# Patient Record
Sex: Female | Born: 1970
Health system: Southern US, Community
[De-identification: ages and names within clinical notes are randomized; demographics above are authoritative.]

## PROBLEM LIST (undated history)

## (undated) DIAGNOSIS — M25474 Effusion, right foot: Secondary | ICD-10-CM

## (undated) DIAGNOSIS — M5430 Sciatica, unspecified side: Secondary | ICD-10-CM

## (undated) DIAGNOSIS — E559 Vitamin D deficiency, unspecified: Secondary | ICD-10-CM

## (undated) DIAGNOSIS — M25472 Effusion, left ankle: Secondary | ICD-10-CM

## (undated) DIAGNOSIS — D649 Anemia, unspecified: Secondary | ICD-10-CM

## (undated) DIAGNOSIS — M25569 Pain in unspecified knee: Secondary | ICD-10-CM

## (undated) DIAGNOSIS — M25471 Effusion, right ankle: Secondary | ICD-10-CM

## (undated) DIAGNOSIS — M549 Dorsalgia, unspecified: Secondary | ICD-10-CM

## (undated) DIAGNOSIS — M25475 Effusion, left foot: Secondary | ICD-10-CM

## (undated) DIAGNOSIS — T7840XA Allergy, unspecified, initial encounter: Secondary | ICD-10-CM

## (undated) HISTORY — DX: Allergy, unspecified, initial encounter: T78.40XA

## (undated) HISTORY — DX: Vitamin D deficiency, unspecified: E55.9

## (undated) HISTORY — DX: Effusion, right foot: M25.474

## (undated) HISTORY — DX: Effusion, left foot: M25.475

## (undated) HISTORY — DX: Pain in unspecified knee: M25.569

## (undated) HISTORY — DX: Anemia, unspecified: D64.9

## (undated) HISTORY — DX: Dorsalgia, unspecified: M54.9

## (undated) HISTORY — PX: NOSE SURGERY: SHX723

## (undated) HISTORY — DX: Effusion, right ankle: M25.471

## (undated) HISTORY — PX: ABLATION: SHX5711

## (undated) HISTORY — DX: Effusion, left ankle: M25.472

## (undated) HISTORY — DX: Sciatica, unspecified side: M54.30

---

## 2004-12-31 HISTORY — PX: TUBAL LIGATION: SHX77

## 2015-01-04 ENCOUNTER — Ambulatory Visit
Admission: RE | Admit: 2015-01-04 | Discharge: 2015-01-04 | Disposition: A | Discharge status: Home / Self Care | Payer: Medicaid Other | Source: Ambulatory Visit | Attending: Family Medicine | Admitting: Family Medicine

## 2015-01-04 ENCOUNTER — Other Ambulatory Visit: Payer: Self-pay | Admitting: Family Medicine

## 2015-01-04 DIAGNOSIS — M545 Low back pain, unspecified: Secondary | ICD-10-CM

## 2015-01-04 DIAGNOSIS — G8929 Other chronic pain: Secondary | ICD-10-CM

## 2015-08-13 ENCOUNTER — Other Ambulatory Visit: Payer: Self-pay

## 2015-08-13 DIAGNOSIS — Z1231 Encounter for screening mammogram for malignant neoplasm of breast: Secondary | ICD-10-CM

## 2015-08-22 ENCOUNTER — Ambulatory Visit
Admission: RE | Admit: 2015-08-22 | Discharge: 2015-08-22 | Disposition: A | Discharge status: Home / Self Care | Payer: Medicaid Other | Source: Ambulatory Visit

## 2015-08-22 DIAGNOSIS — Z1231 Encounter for screening mammogram for malignant neoplasm of breast: Secondary | ICD-10-CM

## 2016-09-10 DIAGNOSIS — Z23 Encounter for immunization: Secondary | ICD-10-CM | POA: Diagnosis not present

## 2016-10-09 ENCOUNTER — Other Ambulatory Visit: Payer: Self-pay | Admitting: Family Medicine

## 2016-10-09 DIAGNOSIS — Z1231 Encounter for screening mammogram for malignant neoplasm of breast: Secondary | ICD-10-CM

## 2016-10-29 ENCOUNTER — Ambulatory Visit: Payer: Medicaid Other

## 2016-11-19 ENCOUNTER — Ambulatory Visit
Admission: RE | Admit: 2016-11-19 | Discharge: 2016-11-19 | Disposition: A | Discharge status: Home / Self Care | Payer: Medicaid Other | Source: Ambulatory Visit | Attending: Family Medicine | Admitting: Family Medicine

## 2016-11-19 DIAGNOSIS — Z1231 Encounter for screening mammogram for malignant neoplasm of breast: Secondary | ICD-10-CM

## 2017-01-17 ENCOUNTER — Emergency Department (HOSPITAL_COMMUNITY)
Admission: EM | Admit: 2017-01-17 | Discharge: 2017-01-17 | Disposition: A | Discharge status: Home / Self Care | Payer: Medicaid Other | Attending: Emergency Medicine | Admitting: Emergency Medicine

## 2017-01-17 ENCOUNTER — Emergency Department (HOSPITAL_COMMUNITY): Payer: Medicaid Other

## 2017-01-17 ENCOUNTER — Encounter (HOSPITAL_COMMUNITY): Payer: Self-pay

## 2017-01-17 DIAGNOSIS — D649 Anemia, unspecified: Secondary | ICD-10-CM | POA: Insufficient documentation

## 2017-01-17 DIAGNOSIS — R0789 Other chest pain: Secondary | ICD-10-CM | POA: Insufficient documentation

## 2017-01-17 LAB — CBC
HCT: 34.4 % — ABNORMAL LOW (ref 36.0–46.0)
Hemoglobin: 11 g/dL — ABNORMAL LOW (ref 12.0–15.0)
MCH: 28.9 pg (ref 26.0–34.0)
MCHC: 32 g/dL (ref 30.0–36.0)
MCV: 90.3 fL (ref 78.0–100.0)
Platelets: 325 10*3/uL (ref 150–400)
RBC: 3.81 MIL/uL — ABNORMAL LOW (ref 3.87–5.11)
RDW: 14.1 % (ref 11.5–15.5)
WBC: 8.3 10*3/uL (ref 4.0–10.5)

## 2017-01-17 LAB — URINALYSIS, ROUTINE W REFLEX MICROSCOPIC
Bacteria, UA: NONE SEEN
Bilirubin Urine: NEGATIVE
Glucose, UA: NEGATIVE mg/dL
Hgb urine dipstick: NEGATIVE
Ketones, ur: NEGATIVE mg/dL
Nitrite: NEGATIVE
Protein, ur: 30 mg/dL — AB
Specific Gravity, Urine: 1.027 (ref 1.005–1.030)
pH: 5 (ref 5.0–8.0)

## 2017-01-17 LAB — BASIC METABOLIC PANEL
Anion gap: 8 (ref 5–15)
BUN: 7 mg/dL (ref 6–20)
CO2: 24 mmol/L (ref 22–32)
Calcium: 9 mg/dL (ref 8.9–10.3)
Chloride: 107 mmol/L (ref 101–111)
Creatinine, Ser: 0.81 mg/dL (ref 0.44–1.00)
GFR calc Af Amer: >60 mL/min (ref >60)
GFR calc non Af Amer: >60 mL/min (ref >60)
Glucose, Bld: 138 mg/dL — ABNORMAL HIGH (ref 65–99)
Potassium: 4 mmol/L (ref 3.5–5.1)
Sodium: 139 mmol/L (ref 135–145)

## 2017-01-17 MED ORDER — HYDROMORPHONE HCL 2 MG/ML IJ SOLN
1.0000 mg | Freq: Once | INTRAMUSCULAR | Status: AC
  Administered 2017-01-17: 1 mg via INTRAVENOUS
  Filled 2017-01-17: qty 1

## 2017-01-17 MED ORDER — MORPHINE SULFATE (PF) 4 MG/ML IV SOLN
4.0000 mg | Freq: Once | INTRAVENOUS | Status: AC
  Administered 2017-01-17: 4 mg via INTRAVENOUS
  Filled 2017-01-17: qty 1

## 2017-01-17 MED ORDER — ONDANSETRON HCL 4 MG/2ML IJ SOLN
4.0000 mg | Freq: Once | INTRAMUSCULAR | Status: AC
  Administered 2017-01-17: 4 mg via INTRAVENOUS
  Filled 2017-01-17: qty 2

## 2017-01-17 MED ORDER — OXYCODONE-ACETAMINOPHEN 5-325 MG PO TABS: 1.0000 | ORAL_TABLET | ORAL | 0 refills | Status: DC | PRN

## 2017-01-17 MED ORDER — KETOROLAC TROMETHAMINE 60 MG/2ML IM SOLN
60.0000 mg | Freq: Once | INTRAMUSCULAR | Status: AC
  Administered 2017-01-17: 60 mg via INTRAMUSCULAR
  Filled 2017-01-17: qty 2

## 2017-01-17 NOTE — Discharge Instructions (Signed)
Return if symptoms are getting worse. °

## 2017-01-17 NOTE — ED Notes (Signed)
Pt made aware that we need a urine sample.

## 2017-01-17 NOTE — ED Provider Notes (Signed)
[facility]-EMERGENCY DEPT Provider Note   CSN: [id number] Arrival date & time: 01/17/17  0226    By signing my name below, I, Valentino Saxon, attest that this documentation has been prepared under the direction and in the presence of Dione Booze, MD. Electronically Signed: Valentino Saxon, ED Scribe. 01/17/17. 2:54 AM.  History   Chief Complaint Chief Complaint  Patient presents with  . Flank Pain   The history is provided by the patient. No language interpreter was used.   HPI Comments: Melissa Rasmussen is a 46 y.o. female brought in by ambulance who presents to the Emergency Department complaining of 10/10, constant, left-sided flank pain that occurred yesterday morning. Pt denies recent trauma or injury to the affected area. Pt states she suddenly woke up with her pain and notes it has not subsided. She states her pain has gradually worsened with breathing, movement and direct pressure to area. No alleviating factors noted. She denies use of OTC medications and/or heat and ice therapy. Pt denies cough, fever, chills, nausea, vomiting, trouble urinating. Pt notes hx of tubal ligation.   History reviewed. No pertinent past medical history.  There are no active problems to display for this patient.   History reviewed. No pertinent surgical history.  OB History    No data available       Home Medications    Prior to Admission medications   Not on File    Family History History reviewed. No pertinent family history.  Social History Social History  Substance Use Topics  . Smoking status: Never Smoker  . Smokeless tobacco: Not on file  . Alcohol use Yes     Comment: occ     Allergies   Patient has no known allergies.   Review of Systems Review of Systems  Constitutional: Negative for chills and fever.  Respiratory: Negative for cough.   Gastrointestinal: Negative for nausea and vomiting.  Genitourinary: Positive for flank pain. Negative for difficulty urinating.    All other systems reviewed and are negative.    Physical Exam Updated Vital Signs BP 107/71   Pulse 95   Temp 98.3 F (36.8 C) (Oral)   Resp 16   Ht 5\' 2"  (1.575 m)   Wt 240 lb (108.9 kg)   LMP 01/11/2017 (Exact Date)   SpO2 100%   BMI 43.90 kg/m   Physical Exam  Constitutional: She is oriented to person, place, and time. She appears well-developed and well-nourished.  Appears uncomfroatble.   HENT:  Head: Normocephalic and atraumatic.  Eyes: EOM are normal. Pupils are equal, round, and reactive to light.  Neck: Normal range of motion. Neck supple. No JVD present.  Cardiovascular: Normal rate, regular rhythm and normal heart sounds.   No murmur heard. Pulmonary/Chest: Effort normal and breath sounds normal. She has no wheezes. She has no rales. She exhibits tenderness.  Marked tenderness over left lateral ribcage.   Abdominal: Soft. Bowel sounds are normal. She exhibits no distension and no mass. There is no tenderness.  Musculoskeletal: Normal range of motion. She exhibits no edema.  Lymphadenopathy:    She has no cervical adenopathy.  Neurological: She is alert and oriented to person, place, and time. No cranial nerve deficit. She exhibits normal muscle tone. Coordination normal.  Skin: Skin is warm and dry. No rash noted.  Psychiatric: She has a normal mood and affect. Her behavior is normal. Judgment and thought content normal.  Nursing note and vitals reviewed.    ED Treatments / Results  DIAGNOSTIC STUDIES: Oxygen Saturation is 100% on RA, normal by my interpretation.    COORDINATION OF CARE: 2:50 AM Discussed treatment plan with pt at bedside which includes labs, unilateral rib with chest imaging and pain medication and pt agreed to plan.   Labs (all labs ordered are listed, but only abnormal results are displayed) Labs Reviewed  URINALYSIS, ROUTINE W REFLEX MICROSCOPIC - Abnormal; Notable for the following:       Result Value   APPearance HAZY (*)     Protein, ur 30 (*)    Leukocytes, UA TRACE (*)    Squamous Epithelial / LPF 6-30 (*)    All other components within normal limits  BASIC METABOLIC PANEL - Abnormal; Notable for the following:    Glucose, Bld 138 (*)    All other components within normal limits  CBC - Abnormal; Notable for the following:    RBC 3.81 (*)    Hemoglobin 11.0 (*)    HCT 34.4 (*)    All other components within normal limits    Radiology Dg Ribs Unilateral W/chest Left  Result Date: 01/17/2017 CLINICAL DATA:  Left-sided anterior rib pain EXAM: LEFT RIBS AND CHEST - 3+ VIEW COMPARISON:  None. FINDINGS: No fracture or other bone lesions are seen involving the ribs. There is no evidence of pneumothorax or pleural effusion. Both lungs are clear. Heart size and mediastinal contours are within normal limits. IMPRESSION: Negative. Electronically Signed   By: Tollie Ethavid  Kwon M.D.   On: 01/17/2017 03:29    Procedures Procedures (including critical care time)  Medications Ordered in ED Medications  ketorolac (TORADOL) injection 60 mg (60 mg Intramuscular Given 01/17/17 0303)  morphine 4 MG/ML injection 4 mg (4 mg Intravenous Given 01/17/17 0455)  HYDROmorphone (DILAUDID) injection 1 mg (1 mg Intravenous Given 01/17/17 0744)  ondansetron (ZOFRAN) injection 4 mg (4 mg Intravenous Given 01/17/17 0743)     Initial Impression / Assessment and Plan / ED Course  I have reviewed the triage vital signs and the nursing notes.  Pertinent labs & imaging results that were available during my care of the patient were reviewed by me and considered in my medical decision making (see chart for details).  Left chest pain which appears to be chest wall pain. No rash visible to suggest herpes zoster. ED workup is unremarkable except for mild anemia. She was given ketorolac which has not given her any relief whatsoever. She is given 2 doses of morphine which gave minimal relief. She's given a dose of hydromorphone with good relief of pain.  She is discharged with prescription for oxycodone have acetaminophen and is referred back to PCP for follow-up. Return precautions discussed.  Final Clinical Impressions(s) / ED Diagnoses   Final diagnoses:  Left-sided chest wall pain  Normochromic normocytic anemia    New Prescriptions New Prescriptions   OXYCODONE-ACETAMINOPHEN (PERCOCET) 5-325 MG TABLET    Take 1-2 tablets by mouth every 4 (four) hours as needed for moderate pain or severe pain.    I personally performed the services described in this documentation, which was scribed in my presence. The recorded information has been reviewed and is accurate.       Dione Boozeavid Gennesis Hogland, MD 01/17/17 [phone]

## 2017-01-17 NOTE — ED Triage Notes (Signed)
Per EMS, pt from home, pt woke up to left flank pain, worse with movement and breathing, denies injury or urinary sx. Pt states it's been going on all day. Denies nausea. Pt ambulatory with EMS to stretcher. VS 120/88, Pulse 110, RR 16, spo2 100%.

## 2017-02-12 DIAGNOSIS — M545 Low back pain: Secondary | ICD-10-CM | POA: Diagnosis not present

## 2017-02-12 DIAGNOSIS — M25511 Pain in right shoulder: Secondary | ICD-10-CM | POA: Diagnosis not present

## 2017-02-12 DIAGNOSIS — J309 Allergic rhinitis, unspecified: Secondary | ICD-10-CM | POA: Diagnosis not present

## 2017-02-19 ENCOUNTER — Other Ambulatory Visit (HOSPITAL_BASED_OUTPATIENT_CLINIC_OR_DEPARTMENT_OTHER): Payer: Self-pay

## 2017-02-19 DIAGNOSIS — G4700 Insomnia, unspecified: Secondary | ICD-10-CM

## 2017-02-25 DIAGNOSIS — R32 Unspecified urinary incontinence: Secondary | ICD-10-CM | POA: Diagnosis not present

## 2017-02-25 DIAGNOSIS — N760 Acute vaginitis: Secondary | ICD-10-CM | POA: Diagnosis not present

## 2017-02-25 DIAGNOSIS — D573 Sickle-cell trait: Secondary | ICD-10-CM | POA: Insufficient documentation

## 2017-02-25 DIAGNOSIS — Z6841 Body Mass Index (BMI) 40.0 and over, adult: Secondary | ICD-10-CM | POA: Diagnosis not present

## 2017-02-25 DIAGNOSIS — G43909 Migraine, unspecified, not intractable, without status migrainosus: Secondary | ICD-10-CM | POA: Insufficient documentation

## 2017-02-25 DIAGNOSIS — Z01419 Encounter for gynecological examination (general) (routine) without abnormal findings: Secondary | ICD-10-CM | POA: Diagnosis not present

## 2017-02-25 DIAGNOSIS — Z139 Encounter for screening, unspecified: Secondary | ICD-10-CM | POA: Diagnosis not present

## 2017-02-25 DIAGNOSIS — N915 Oligomenorrhea, unspecified: Secondary | ICD-10-CM | POA: Diagnosis not present

## 2017-02-25 DIAGNOSIS — Z124 Encounter for screening for malignant neoplasm of cervix: Secondary | ICD-10-CM | POA: Diagnosis not present

## 2017-03-01 ENCOUNTER — Other Ambulatory Visit: Payer: Self-pay | Admitting: Family Medicine

## 2017-03-01 ENCOUNTER — Ambulatory Visit
Admission: RE | Admit: 2017-03-01 | Discharge: 2017-03-01 | Disposition: A | Discharge status: Home / Self Care | Payer: BLUE CROSS/BLUE SHIELD | Source: Ambulatory Visit | Attending: Family Medicine | Admitting: Family Medicine

## 2017-03-01 DIAGNOSIS — M25511 Pain in right shoulder: Secondary | ICD-10-CM

## 2017-03-11 DIAGNOSIS — D259 Leiomyoma of uterus, unspecified: Secondary | ICD-10-CM | POA: Diagnosis not present

## 2017-03-11 DIAGNOSIS — N898 Other specified noninflammatory disorders of vagina: Secondary | ICD-10-CM | POA: Diagnosis not present

## 2017-03-15 DIAGNOSIS — L02611 Cutaneous abscess of right foot: Secondary | ICD-10-CM | POA: Diagnosis not present

## 2017-03-15 DIAGNOSIS — M79674 Pain in right toe(s): Secondary | ICD-10-CM | POA: Diagnosis not present

## 2017-03-15 DIAGNOSIS — L03031 Cellulitis of right toe: Secondary | ICD-10-CM | POA: Diagnosis not present

## 2017-03-16 ENCOUNTER — Ambulatory Visit (HOSPITAL_BASED_OUTPATIENT_CLINIC_OR_DEPARTMENT_OTHER): Payer: BLUE CROSS/BLUE SHIELD | Attending: Family Medicine | Admitting: Internal Medicine

## 2017-03-16 DIAGNOSIS — G4700 Insomnia, unspecified: Secondary | ICD-10-CM | POA: Insufficient documentation

## 2017-03-16 DIAGNOSIS — G4733 Obstructive sleep apnea (adult) (pediatric): Secondary | ICD-10-CM | POA: Insufficient documentation

## 2017-03-20 DIAGNOSIS — G4700 Insomnia, unspecified: Secondary | ICD-10-CM | POA: Diagnosis not present

## 2017-03-20 NOTE — Procedures (Signed)
   Patient Name: Melissa Rasmussen, Melissa Rasmussen Date: 03/16/2017 Gender: Female D.O.B: Jan 21, 1971 Age (years): 45 Referring Provider: Renaye Rakers Height (inches): 63 Interpreting Physician: Jetty Duhamel MD, ABSM Weight (lbs): 250 RPSGT: [place] Sink BMI: 44 MRN: [id number] Neck Size: 15.00 CLINICAL INFORMATION Sleep Study Type: HST  Indication for sleep study: Insomnia  Epworth Sleepiness Score: 8  SLEEP STUDY TECHNIQUE A multi-channel overnight portable sleep study was performed. The channels recorded were: nasal airflow, thoracic respiratory movement, and oxygen saturation with a pulse oximetry. Snoring was also monitored.  MEDICATIONS Patient self administered medications include: none reported.  SLEEP ARCHITECTURE Patient was studied for 385.0 minutes. The sleep efficiency was 97.5 % and the patient was supine for 98.8%. The arousal index was 0.0 per hour.  RESPIRATORY PARAMETERS The overall AHI was 12.2 per hour, with a central apnea index of 0.2 per hour.  The oxygen nadir was 79% during sleep.  CARDIAC DATA Mean heart rate during sleep was 87.5 bpm.  IMPRESSIONS - Mild obstructive sleep apnea occurred during this study (AHI = 12.2/h). - No significant central sleep apnea occurred during this study (CAI = 0.2/h). - Severe oxygen desaturation was noted during this study (Min O2 = 79%, Mean 96%). - Patient snored.  DIAGNOSIS - Obstructive Sleep Apnea (327.23 [G47.33 ICD-10])  RECOMMENDATIONS - Therapeutic CPAP titration to determine optimal pressure required to alleviate sleep disordered breathing. - Oral appliance may be considered. - Avoid alcohol, sedatives and other CNS depressants that may worsen sleep apnea and disrupt normal sleep architecture. - Sleep hygiene should be reviewed to assess factors that may improve sleep quality. - Weight management and regular exercise should be initiated or continued.  [Electronically signed] 03/20/2017 01:47  PM  Jetty Duhamel MD, ABSM Diplomate, American Board of Sleep Medicine   NPI: [id number]  Waymon Budge Diplomate, American Board of Sleep Medicine  ELECTRONICALLY SIGNED ON:  03/20/2017, 1:44 PM [facility] PH: [phone]   FX: [phone] ACCREDITED BY THE AMERICAN ACADEMY OF SLEEP MEDICINE

## 2017-03-25 DIAGNOSIS — M79674 Pain in right toe(s): Secondary | ICD-10-CM | POA: Diagnosis not present

## 2017-03-25 DIAGNOSIS — L03031 Cellulitis of right toe: Secondary | ICD-10-CM | POA: Diagnosis not present

## 2017-03-30 ENCOUNTER — Ambulatory Visit
Admission: RE | Admit: 2017-03-30 | Discharge: 2017-03-30 | Disposition: A | Discharge status: Home / Self Care | Payer: BLUE CROSS/BLUE SHIELD | Source: Ambulatory Visit | Attending: Family Medicine | Admitting: Family Medicine

## 2017-03-30 ENCOUNTER — Other Ambulatory Visit: Payer: Self-pay | Admitting: Family Medicine

## 2017-03-30 DIAGNOSIS — M545 Low back pain: Secondary | ICD-10-CM | POA: Diagnosis not present

## 2017-04-02 DIAGNOSIS — D259 Leiomyoma of uterus, unspecified: Secondary | ICD-10-CM | POA: Diagnosis not present

## 2017-06-17 DIAGNOSIS — E7800 Pure hypercholesterolemia, unspecified: Secondary | ICD-10-CM | POA: Diagnosis not present

## 2017-07-02 DIAGNOSIS — G43911 Migraine, unspecified, intractable, with status migrainosus: Secondary | ICD-10-CM | POA: Diagnosis not present

## 2017-07-02 DIAGNOSIS — F5001 Anorexia nervosa, restricting type: Secondary | ICD-10-CM | POA: Diagnosis not present

## 2017-07-02 DIAGNOSIS — F5101 Primary insomnia: Secondary | ICD-10-CM | POA: Diagnosis not present

## 2017-07-02 DIAGNOSIS — E6609 Other obesity due to excess calories: Secondary | ICD-10-CM | POA: Diagnosis not present

## 2017-07-02 DIAGNOSIS — I10 Essential (primary) hypertension: Secondary | ICD-10-CM | POA: Diagnosis not present

## 2017-07-02 DIAGNOSIS — R635 Abnormal weight gain: Secondary | ICD-10-CM | POA: Diagnosis not present

## 2017-07-12 DIAGNOSIS — E6609 Other obesity due to excess calories: Secondary | ICD-10-CM | POA: Diagnosis not present

## 2017-07-12 DIAGNOSIS — M545 Low back pain: Secondary | ICD-10-CM | POA: Diagnosis not present

## 2017-08-05 DIAGNOSIS — E6609 Other obesity due to excess calories: Secondary | ICD-10-CM | POA: Diagnosis not present

## 2017-09-02 DIAGNOSIS — M7672 Peroneal tendinitis, left leg: Secondary | ICD-10-CM | POA: Diagnosis not present

## 2017-09-16 DIAGNOSIS — Z6841 Body Mass Index (BMI) 40.0 and over, adult: Secondary | ICD-10-CM | POA: Diagnosis not present

## 2017-09-16 DIAGNOSIS — R110 Nausea: Secondary | ICD-10-CM | POA: Diagnosis not present

## 2017-10-13 DIAGNOSIS — M7672 Peroneal tendinitis, left leg: Secondary | ICD-10-CM | POA: Diagnosis not present

## 2017-11-04 DIAGNOSIS — M7672 Peroneal tendinitis, left leg: Secondary | ICD-10-CM | POA: Diagnosis not present

## 2018-03-08 DIAGNOSIS — Z01419 Encounter for gynecological examination (general) (routine) without abnormal findings: Secondary | ICD-10-CM | POA: Diagnosis not present

## 2018-03-08 DIAGNOSIS — Z113 Encounter for screening for infections with a predominantly sexual mode of transmission: Secondary | ICD-10-CM | POA: Diagnosis not present

## 2018-03-08 DIAGNOSIS — Z6841 Body Mass Index (BMI) 40.0 and over, adult: Secondary | ICD-10-CM | POA: Diagnosis not present

## 2018-03-08 DIAGNOSIS — Z139 Encounter for screening, unspecified: Secondary | ICD-10-CM | POA: Diagnosis not present

## 2018-03-08 DIAGNOSIS — Z124 Encounter for screening for malignant neoplasm of cervix: Secondary | ICD-10-CM | POA: Diagnosis not present

## 2018-07-14 ENCOUNTER — Other Ambulatory Visit: Payer: Self-pay | Admitting: Family Medicine

## 2018-07-14 DIAGNOSIS — Z1231 Encounter for screening mammogram for malignant neoplasm of breast: Secondary | ICD-10-CM

## 2018-08-15 ENCOUNTER — Ambulatory Visit
Admission: RE | Admit: 2018-08-15 | Discharge: 2018-08-15 | Disposition: A | Discharge status: Home / Self Care | Payer: BLUE CROSS/BLUE SHIELD | Source: Ambulatory Visit | Attending: Family Medicine | Admitting: Family Medicine

## 2018-08-15 DIAGNOSIS — Z1231 Encounter for screening mammogram for malignant neoplasm of breast: Secondary | ICD-10-CM

## 2018-09-29 IMAGING — CR DG SHOULDER 2+V*R*
4 series · 4 of 4 positions shown · non-contrast
Comparison: None.

CLINICAL DATA: Right shoulder pain, no known injury

EXAM:
RIGHT SHOULDER - 2+ VIEW

[w shoulder grashey right]
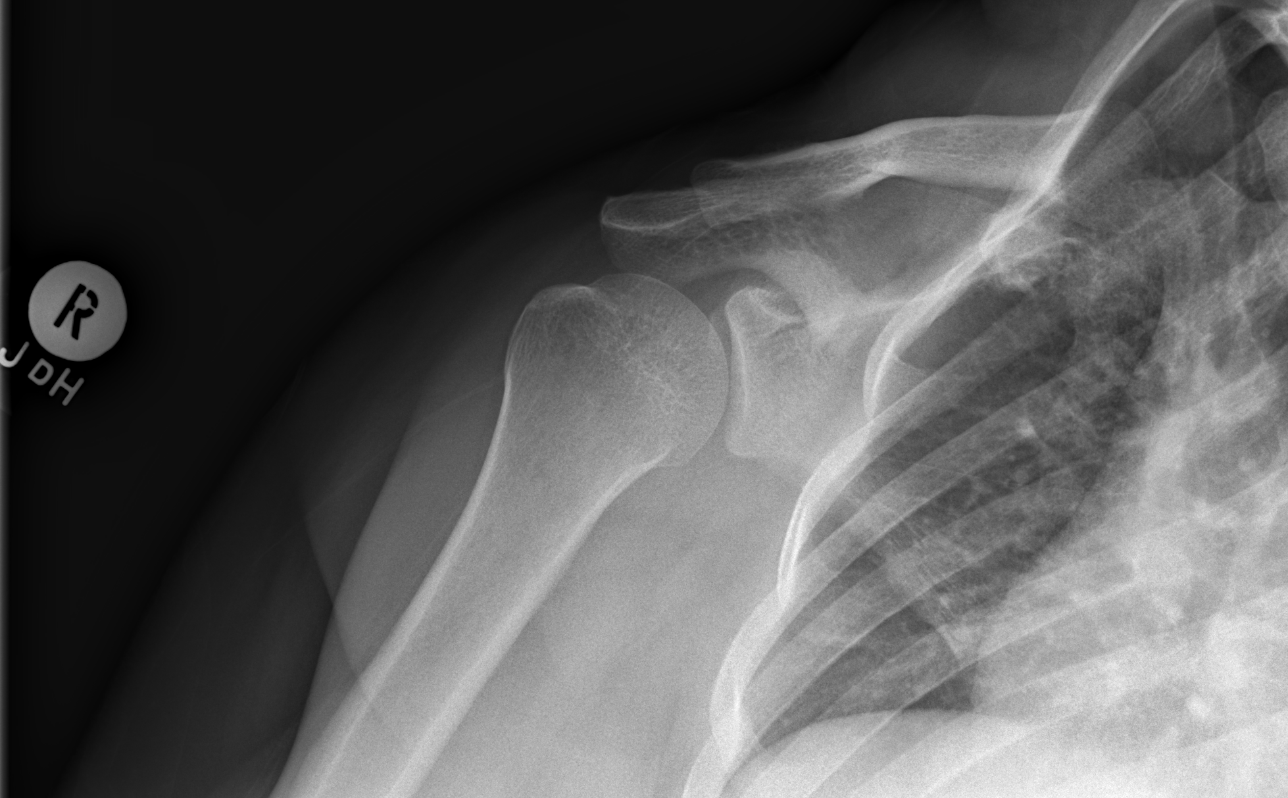

[w shoulder y-view right (1 of 2)]
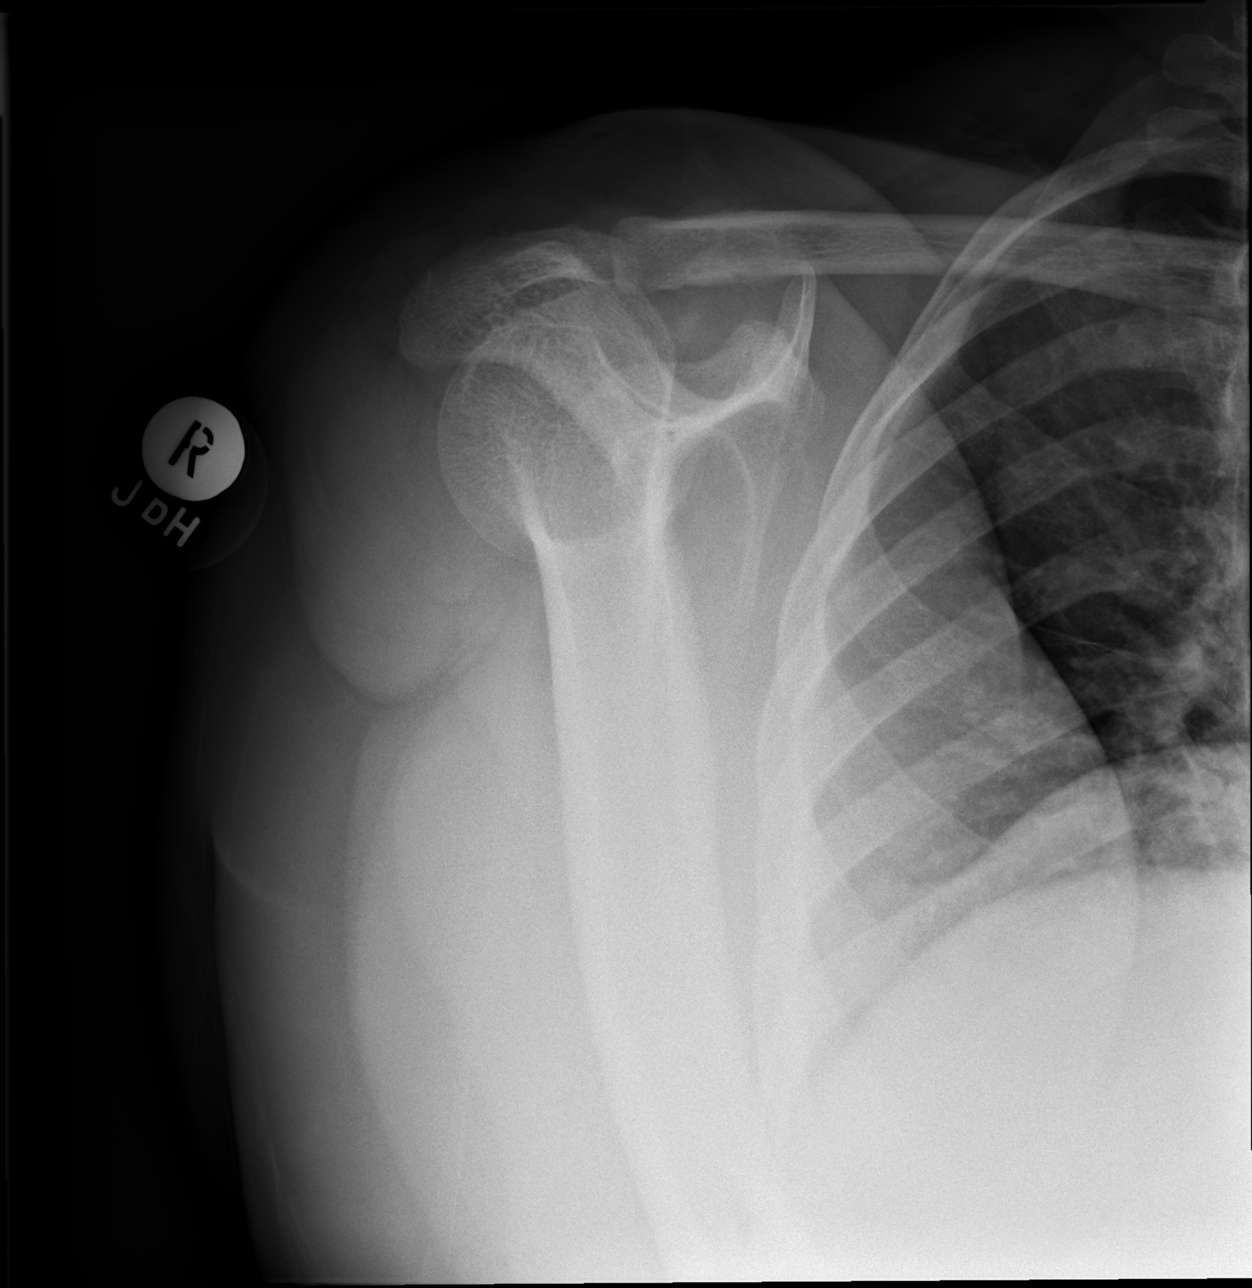

[w shoulder y-view right (2 of 2)]
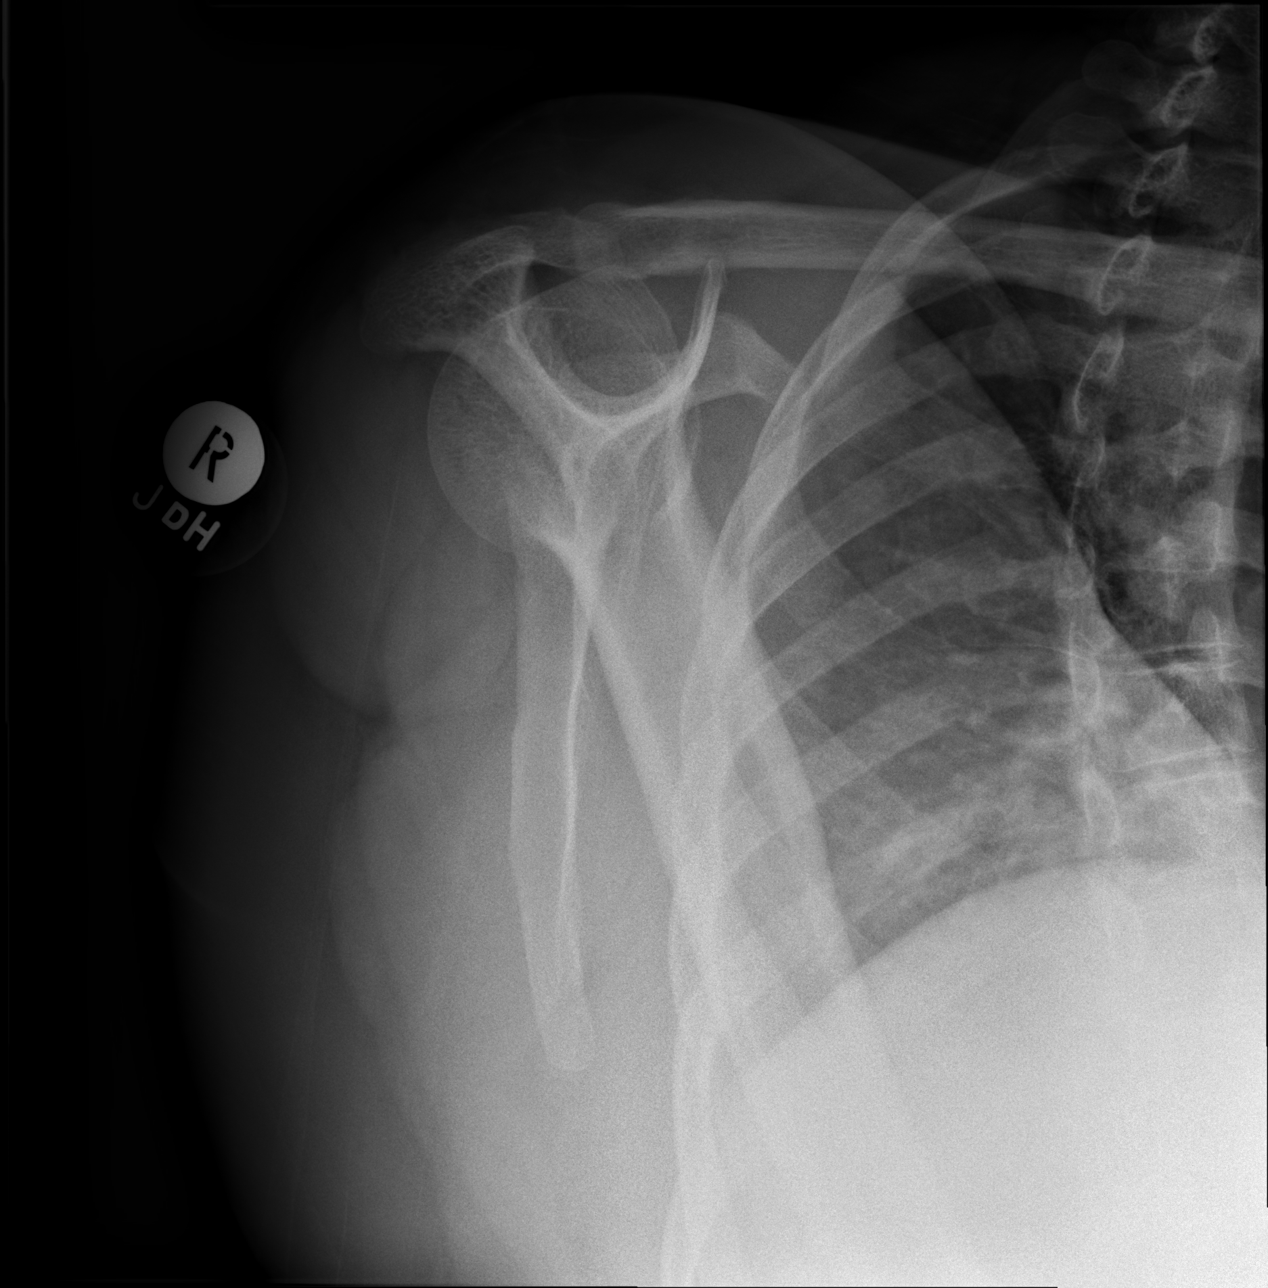

[w shoulder axillary right]
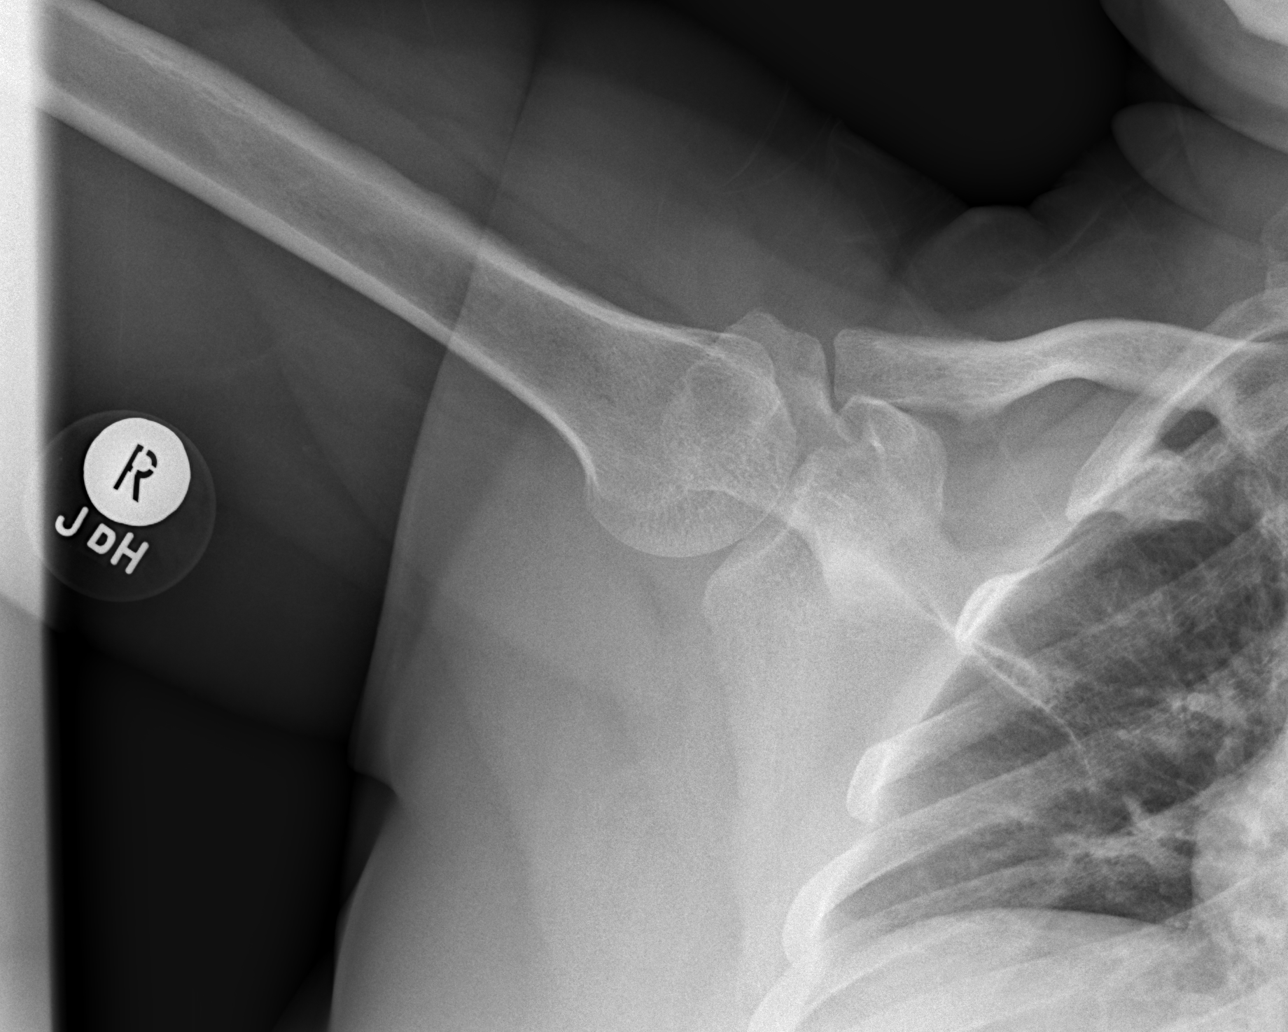

[4 of 4 positions shown; findings below may reference images not displayed]

FINDINGS: Four views of the right shoulder submitted. No acute fracture or
subluxation. AC joint and glenohumeral joint are preserved.
IMPRESSION: Negative.

## 2019-03-14 DIAGNOSIS — Z124 Encounter for screening for malignant neoplasm of cervix: Secondary | ICD-10-CM | POA: Diagnosis not present

## 2019-03-14 DIAGNOSIS — D649 Anemia, unspecified: Secondary | ICD-10-CM | POA: Diagnosis not present

## 2019-03-14 DIAGNOSIS — Z01419 Encounter for gynecological examination (general) (routine) without abnormal findings: Secondary | ICD-10-CM | POA: Diagnosis not present

## 2019-05-15 DIAGNOSIS — H10029 Other mucopurulent conjunctivitis, unspecified eye: Secondary | ICD-10-CM | POA: Diagnosis not present

## 2019-06-15 DIAGNOSIS — R799 Abnormal finding of blood chemistry, unspecified: Secondary | ICD-10-CM | POA: Diagnosis not present

## 2019-06-15 DIAGNOSIS — R110 Nausea: Secondary | ICD-10-CM | POA: Diagnosis not present

## 2019-06-15 DIAGNOSIS — R7309 Other abnormal glucose: Secondary | ICD-10-CM | POA: Diagnosis not present

## 2019-06-15 DIAGNOSIS — H10021 Other mucopurulent conjunctivitis, right eye: Secondary | ICD-10-CM | POA: Diagnosis not present

## 2019-06-28 DIAGNOSIS — M25561 Pain in right knee: Secondary | ICD-10-CM | POA: Diagnosis not present

## 2019-08-09 ENCOUNTER — Ambulatory Visit (INDEPENDENT_AMBULATORY_CARE_PROVIDER_SITE_OTHER): Payer: Self-pay | Admitting: Family Medicine

## 2019-08-17 DIAGNOSIS — Z1231 Encounter for screening mammogram for malignant neoplasm of breast: Secondary | ICD-10-CM | POA: Diagnosis not present

## 2019-08-23 ENCOUNTER — Ambulatory Visit (INDEPENDENT_AMBULATORY_CARE_PROVIDER_SITE_OTHER): Payer: Self-pay | Admitting: Family Medicine

## 2019-08-23 ENCOUNTER — Ambulatory Visit (INDEPENDENT_AMBULATORY_CARE_PROVIDER_SITE_OTHER): Payer: BC Managed Care – PPO | Admitting: Family Medicine

## 2019-08-23 ENCOUNTER — Other Ambulatory Visit: Payer: Self-pay

## 2019-08-23 ENCOUNTER — Encounter: Payer: Self-pay | Admitting: Family Medicine

## 2019-08-23 ENCOUNTER — Encounter (INDEPENDENT_AMBULATORY_CARE_PROVIDER_SITE_OTHER): Payer: Self-pay | Admitting: Family Medicine

## 2019-08-23 VITALS — BP 122/82 | HR 90 | Temp 98.4°F | Ht 63.0 in | Wt 267.0 lb

## 2019-08-23 DIAGNOSIS — R0602 Shortness of breath: Secondary | ICD-10-CM | POA: Diagnosis not present

## 2019-08-23 DIAGNOSIS — F3289 Other specified depressive episodes: Secondary | ICD-10-CM

## 2019-08-23 DIAGNOSIS — Z9189 Other specified personal risk factors, not elsewhere classified: Secondary | ICD-10-CM

## 2019-08-23 DIAGNOSIS — Z6841 Body Mass Index (BMI) 40.0 and over, adult: Secondary | ICD-10-CM

## 2019-08-23 DIAGNOSIS — E559 Vitamin D deficiency, unspecified: Secondary | ICD-10-CM | POA: Diagnosis not present

## 2019-08-23 DIAGNOSIS — Z0289 Encounter for other administrative examinations: Secondary | ICD-10-CM

## 2019-08-23 DIAGNOSIS — R5383 Other fatigue: Secondary | ICD-10-CM | POA: Diagnosis not present

## 2019-08-24 LAB — T4, FREE: Free T4: 0.98 ng/dL (ref 0.82–1.77)

## 2019-08-24 LAB — VITAMIN D 25 HYDROXY (VIT D DEFICIENCY, FRACTURES): Vit D, 25-Hydroxy: 22.6 ng/mL — ABNORMAL LOW (ref 30.0–100.0)

## 2019-08-24 LAB — FOLATE: Folate: >20 ng/mL (ref >3.0)

## 2019-08-24 LAB — INSULIN, RANDOM: INSULIN: 10.1 u[IU]/mL (ref 2.6–24.9)

## 2019-08-24 LAB — T3: T3, Total: 114 ng/dL (ref 71–180)

## 2019-08-24 LAB — VITAMIN B12: Vitamin B-12: 580 pg/mL (ref 232–1245)

## 2019-08-24 LAB — TSH: TSH: 1.9 u[IU]/mL (ref 0.450–4.500)

## 2019-08-28 NOTE — Progress Notes (Signed)
Office: [phone]  /  Fax: [phone]   Dear Dr. Eulah Rasmussen,   Thank you for referring Melissa Rasmussen to our clinic. The following note includes my evaluation and treatment recommendations.  HPI:   Chief Complaint: OBESITY    Melissa Rasmussen has been referred by Melissa Rasmussen. Melissa Pont, MD for consultation regarding her obesity and obesity related comorbidities.    Melissa Rasmussen (MR# [id number]) is a 48 y.o. female who presents on 08/23/2019 for obesity evaluation and treatment. Current BMI is Body mass index is 47.3 kg/m. Melissa Rasmussen has been struggling with her weight for many years and has been unsuccessful in either losing weight, maintaining weight loss, or reaching her healthy weight goal.     Melissa Rasmussen states for breakfast, she is doing tea. For lunch she is doing blueberry/strawberry oatmeal, or 2 eggs and 4-5 pieces of bacon (feels full from oatmeal). Around 4 pm she is doing cookies (6 count), possibly 2 packs. For dinner, she is doing salmon 2-4 oz pieces and veggies with sweet potato with brown sugar, butter, and cinnamon. After dinner, she is snacking on cookies, oreos, and ice cream.     Melissa Rasmussen attended our information session and states she is currently in the action stage of change and ready to dedicate time achieving and maintaining a healthier weight. Melissa Rasmussen is interested in becoming our patient and working on intensive lifestyle modifications including (but not limited to) diet, exercise and weight loss.    Melissa Rasmussen states her family eats meals together her desired weight loss is 102 lbs she started gaining weight 5 years ago her heaviest weight ever was 269 lbs she has significant food cravings issues  she skips meals frequently she is frequently drinking liquids with calories she frequently makes poor food choices she has problems with excessive hunger  she frequently eats larger portions than normal  she struggles with emotional eating    Fatigue Melissa Rasmussen feels her energy is lower  than it should be. This has worsened with weight gain and has not worsened recently. Melissa Rasmussen admits to daytime somnolence and  admits to waking up still tired. Patient is at risk for obstructive sleep apnea. Patent has a history of symptoms of daytime fatigue. Patient generally gets 4 hours of sleep per night, and states they generally have difficulty falling asleep. Snoring is not present. Apneic episodes are not present. Epworth Sleepiness Score is 11.  Dyspnea on exertion Melissa Rasmussen notes increasing shortness of breath with exercising and seems to be worsening over time with weight gain. She notes getting out of breath sooner with activity than she used to. This has not gotten worse recently. EKG-normal sinus rhythm at 87 BPM. Melissa Rasmussen denies orthopnea.  Vitamin D Deficiency Melissa Rasmussen has a diagnosis of vitamin D deficiency. She has a history of Vit D deficiency. She notes fatigue and denies nausea, vomiting or muscle weakness.  At risk for osteopenia and osteoporosis Melissa Rasmussen is at higher risk of osteopenia and osteoporosis due to vitamin D deficiency.   Depression with Emotional Eating Behaviors Melissa Rasmussen states she is out of control with snacking and inability to control snacking. Melissa Rasmussen struggles with emotional eating and using food for comfort to the extent that it is negatively impacting her health. She often snacks when she is not hungry. Melissa Rasmussen sometimes feels she is out of control and then feels guilty that she made poor food choices. She has been working on behavior modification techniques to help reduce her emotional eating and has been somewhat successful. She shows no sign of  suicidal or homicidal ideations.  Depression Screen Melissa Rasmussen's Food and Mood (modified PHQ-9) score was  Depression screen PHQ 2/9 08/23/2019  Decreased Interest 1  Down, Depressed, Hopeless 0  PHQ - 2 Score 1  Altered sleeping 2  Tired, decreased energy 1  Change in appetite 3  Feeling bad or failure about yourself  1   Trouble concentrating 2  Moving slowly or fidgety/restless 0  Suicidal thoughts 0  PHQ-9 Score 10  Difficult doing work/chores Not difficult at all    ASSESSMENT AND PLAN:  Other fatigue - Plan: EKG 12-Lead, Insulin, random, Vitamin B12, Folate, T3, T4, free, TSH  Shortness of breath on exertion  Vitamin D deficiency - Plan: VITAMIN D 25 Hydroxy (Vit-D Deficiency, Fractures)  Other depression - with emotional eating  At risk for osteoporosis  Class 3 severe obesity with serious comorbidity and body mass index (BMI) of 45.0 to 49.9 in adult, unspecified obesity type (HCC)  PLAN:  Fatigue Melissa Rasmussen was informed that her fatigue may be related to obesity, depression or many other causes. Labs will be ordered, and in the meanwhile Melissa Rasmussen has agreed to work on diet, exercise and weight loss to help with fatigue. Proper sleep hygiene was discussed including the need for 7-8 hours of quality sleep each night. A sleep study was not ordered based on symptoms and Epworth score.  Dyspnea on exertion Melissa Rasmussen's shortness of breath appears to be obesity related and exercise induced. She has agreed to work on weight loss and gradually increase exercise to treat her exercise induced shortness of breath. If Melissa Rasmussen follows our instructions and loses weight without improvement of her shortness of breath, we will plan to refer to pulmonology. We will monitor this condition regularly. Melissa Rasmussen agrees to this plan.  Vitamin D Deficiency Melissa Rasmussen was informed that low vitamin D levels contributes to fatigue and are associated with obesity, breast, and colon cancer. She will follow up for routine testing of vitamin D, at least 2-3 times per year. She was informed of the risk of over-replacement of vitamin D and agrees to not increase her dose unless she discusses this with [name] first. We will check Vit D level today. Melissa Rasmussen agrees to follow up with our clinic in 2 weeks.  At risk for osteopenia and osteoporosis  Melissa Rasmussen was given extended (15 minutes) osteoporosis prevention counseling today. Melissa Rasmussen is at risk for osteopenia and osteoporsis due to her vitamin D deficiency. She was encouraged to take her vitamin D and follow her higher calcium diet and increase strengthening exercise to help strengthen her bones and decrease her risk of osteopenia and osteoporosis.  Depression with Emotional Eating Behaviors We discussed behavior modification techniques today to help Melissa Rasmussen deal with her emotional eating and depression. We will refer to Dr. Mallie Mussel, our Bariatric Psychologist for evaluation. Melissa Rasmussen agrees to follow up with our clinic in 2 weeks.  Depression Screen Melissa Rasmussen had a moderately positive depression screening. Depression is commonly associated with obesity and often results in emotional eating behaviors. We will monitor this closely and work on CBT to help improve the non-hunger eating patterns. Referral to Psychology may be required if no improvement is seen as she continues in our clinic.  Obesity Melissa Rasmussen is currently in the action stage of change and her goal is to continue with weight loss efforts. I recommend [facility] begin the structured treatment plan as follows:  She has agreed to follow the Category 2 plan + 100 calories Melissa Rasmussen has been instructed to eventually work up  to a goal of 150 minutes of combined cardio and strengthening exercise per week for weight loss and overall health benefits. We discussed the following Behavioral Modification Strategies today: increasing lean protein intake, increasing vegetables and work on meal planning and easy cooking plans, keeping healthy foods in the home, and planning for success   She was informed of the importance of frequent follow up visits to maximize her success with intensive lifestyle modifications for her multiple health conditions. She was informed we would discuss her lab results at her next visit unless there is a critical issue that needs to  be addressed sooner. Melissa Rasmussen agreed to keep her next visit at the agreed upon time to discuss these results.  ALLERGIES: No Known Allergies  MEDICATIONS: Current Outpatient Medications on File Prior to Visit  Medication Sig Dispense Refill  . loratadine (CLARITIN) 10 MG tablet Take 10 mg by mouth daily as needed for allergies.    . mometasone (NASONEX) 50 MCG/ACT nasal spray Place 2 sprays into the nose daily.    . Multiple Vitamins-Minerals (MULTIVITAMIN GUMMIES ADULTS PO) Take by mouth.     No current facility-administered medications on file prior to visit.     PAST MEDICAL HISTORY: Past Medical History:  Diagnosis Date  . Allergies   . Anemia   . Back pain with sciatica   . Bilateral swelling of feet and ankles   . Knee pain   . Vitamin D deficiency     PAST SURGICAL HISTORY: Past Surgical History:  Procedure Laterality Date  . NOSE SURGERY  8/06 or 07  . TUBAL LIGATION  12/2004    SOCIAL HISTORY: Social History   Tobacco Use  . Smoking status: Never Smoker  Substance Use Topics  . Alcohol use: Yes    Comment: occ  . Drug use: No    FAMILY HISTORY: Family History  Problem Relation Age of Onset  . Hypertension Mother   . Depression Mother   . Obesity Mother   . Alcoholism Father     ROS: Review of Systems  Constitutional: Positive for malaise/fatigue. Negative for weight loss.       + Trouble sleeping  HENT:       + Nasal stuffiness  Eyes: Positive for blurred vision and double vision.       + Wear glasses or contacts  Respiratory: Positive for shortness of breath (with exertion).   Cardiovascular: Negative for orthopnea.  Gastrointestinal: Negative for nausea and vomiting.  Musculoskeletal: Positive for back pain.       Negative muscle weakness + Muscle or joint pain  Skin:       + Dryness  Neurological: Positive for headaches.    PHYSICAL EXAM: Blood pressure 122/82, pulse 90, temperature 98.4 F (36.9 C), temperature source Oral, height  5\' 3"  (1.6 m), weight 267 lb (121.1 kg), last menstrual period 02/16/2017, SpO2 98 %. Body mass index is 47.3 kg/m. Physical Exam Vitals signs reviewed.  Constitutional:      Appearance: Normal appearance. She is obese.  HENT:     Head: Normocephalic and atraumatic.     Nose: Nose normal.  Eyes:     General: No scleral icterus.    Extraocular Movements: Extraocular movements intact.  Neck:     Musculoskeletal: Normal range of motion and neck supple.     Comments: No thyromegaly present Cardiovascular:     Rate and Rhythm: Normal rate and regular rhythm.     Pulses: Normal pulses.  Heart sounds: Normal heart sounds.  Pulmonary:     Effort: Pulmonary effort is normal. No respiratory distress.     Breath sounds: Normal breath sounds.  Abdominal:     Palpations: Abdomen is soft.     Tenderness: There is no abdominal tenderness.  Musculoskeletal: Normal range of motion.     Right lower leg: No edema.     Left lower leg: No edema.  Skin:    General: Skin is warm and dry.  Neurological:     Mental Status: She is alert and oriented to person, place, and time.     Coordination: Coordination normal.  Psychiatric:        Mood and Affect: Mood normal.        Behavior: Behavior normal.     RECENT LABS AND TESTS: BMET    Component Value Date/Time   NA 139 01/17/2017 0235   K 4.0 01/17/2017 0235   CL 107 01/17/2017 0235   CO2 24 01/17/2017 0235   GLUCOSE 138 (H) 01/17/2017 0235   BUN 7 01/17/2017 0235   CREATININE 0.81 01/17/2017 0235   CALCIUM 9.0 01/17/2017 0235   GFRNONAA >60 01/17/2017 0235   GFRAA >60 01/17/2017 0235   No results found for: HGBA1C Lab Results  Component Value Date   INSULIN 10.1 08/23/2019   CBC    Component Value Date/Time   WBC 8.3 01/17/2017 0235   RBC 3.81 (L) 01/17/2017 0235   HGB 11.0 (L) 01/17/2017 0235   HCT 34.4 (L) 01/17/2017 0235   PLT 325 01/17/2017 0235   MCV 90.3 01/17/2017 0235   MCH 28.9 01/17/2017 0235   MCHC 32.0  01/17/2017 0235   RDW 14.1 01/17/2017 0235   Iron/TIBC/Ferritin/ %Sat No results found for: IRON, TIBC, FERRITIN, IRONPCTSAT Lipid Panel  No results found for: CHOL, TRIG, HDL, CHOLHDL, VLDL, LDLCALC, LDLDIRECT Hepatic Function Panel  No results found for: PROT, ALBUMIN, AST, ALT, ALKPHOS, BILITOT, BILIDIR, IBILI    Component Value Date/Time   TSH 1.900 08/23/2019 1010    ECG  shows NSR with a rate of 87 BPM INDIRECT CALORIMETER done today shows a VO2 of 232 and a REE of 1611.  Her calculated basal metabolic rate is 1308 thus her basal metabolic rate is worse than expected.       OBESITY BEHAVIORAL INTERVENTION VISIT  Today's visit was # 1   Starting weight: 267 lbs Starting date: 08/23/2019 Today's weight : 267 lbs  Today's date: 08/23/2019 Total lbs lost to date: 0    ASK: We discussed the diagnosis of obesity with Melissa Rasmussen today and Melissa Rasmussen agreed to give [name] permission to discuss obesity behavioral modification therapy today.  ASSESS: Melissa Rasmussen has the diagnosis of obesity and her BMI today is 47.31 Adalida is in the action stage of change   ADVISE: Melissa Rasmussen was educated on the multiple health risks of obesity as well as the benefit of weight loss to improve her health. She was advised of the need for long term treatment and the importance of lifestyle modifications to improve her current health and to decrease her risk of future health problems.  AGREE: Multiple dietary modification options and treatment options were discussed and  Mckinlee agreed to follow the recommendations documented in the above note.  ARRANGE: Anorah was educated on the importance of frequent visits to treat obesity as outlined per CMS and USPSTF guidelines and agreed to schedule her next follow up appointment today.  Trude Mcburney, am acting as transcriptionist for  Debbra Riding, MD   I have reviewed the above documentation for accuracy and completeness, and I agree with the above. -  Debbra Riding, MD

## 2019-09-06 ENCOUNTER — Other Ambulatory Visit: Payer: Self-pay

## 2019-09-06 ENCOUNTER — Ambulatory Visit (INDEPENDENT_AMBULATORY_CARE_PROVIDER_SITE_OTHER): Payer: BC Managed Care – PPO | Admitting: Family Medicine

## 2019-09-06 ENCOUNTER — Encounter (INDEPENDENT_AMBULATORY_CARE_PROVIDER_SITE_OTHER): Payer: Self-pay | Admitting: Family Medicine

## 2019-09-06 VITALS — BP 121/82 | HR 85 | Temp 98.0°F | Ht 63.0 in | Wt 267.0 lb

## 2019-09-06 DIAGNOSIS — Z6841 Body Mass Index (BMI) 40.0 and over, adult: Secondary | ICD-10-CM

## 2019-09-06 DIAGNOSIS — E559 Vitamin D deficiency, unspecified: Secondary | ICD-10-CM

## 2019-09-06 DIAGNOSIS — Z9189 Other specified personal risk factors, not elsewhere classified: Secondary | ICD-10-CM | POA: Diagnosis not present

## 2019-09-06 DIAGNOSIS — E8881 Metabolic syndrome: Secondary | ICD-10-CM | POA: Diagnosis not present

## 2019-09-06 MED ORDER — VITAMIN D (ERGOCALCIFEROL) 1.25 MG (50000 UNIT) PO CAPS: 50000.0000 [IU] | ORAL_CAPSULE | ORAL | 0 refills | Status: DC

## 2019-09-07 NOTE — Progress Notes (Signed)
Office: [phone]  /  Fax: [phone]   HPI:   Chief Complaint: OBESITY Melissa Rasmussen is here to discuss her progress with her obesity treatment plan. She is on the Category 2 plan + 100 calories and is following her eating plan approximately 0 % of the time. She states she is exercising 0 minutes 0 times per week. Melissa Rasmussen didn't follow the plan, and she didn't like the bread and wants more options for breakfast. She is rarely eating lunch and dinner is often protein with vegetables. She is not necessarily weighing her meat at dinner everyday. Upon questioning patient reports eating many different foods not on meal plan. Her weight is 267 lb (121.1 kg) today and has not lost weight since her last visit. She has lost 0 lbs since starting treatment with [name].  Vitamin D Deficiency Melissa Rasmussen has a diagnosis of vitamin D deficiency. She is not currently taking OTC Vit D. She notes fatigue and denies nausea, vomiting or muscle weakness.  Insulin Resistance Melissa Rasmussen has a diagnosis of insulin resistance based on her elevated fasting insulin level >5. Last insulin level was of 10.1. her Hgb A1c was controlled on labs done by Dr. Parke Simmers. Although Vy's blood glucose readings are still under good control, insulin resistance puts her at greater risk of metabolic syndrome and diabetes. She is not taking metformin currently and continues to work on diet and exercise to decrease risk of diabetes.  At risk for diabetes Melissa Rasmussen is at higher than average risk for developing diabetes due to her obesity and insulin resistance. She currently denies polyuria or polydipsia.  ASSESSMENT AND PLAN:  Vitamin D deficiency - Plan: Vitamin D, Ergocalciferol, (DRISDOL) 1.25 MG (50000 UT) CAPS capsule  Insulin resistance  At risk for diabetes mellitus  Class 3 severe obesity with serious comorbidity and body mass index (BMI) of 45.0 to 49.9 in adult, unspecified obesity type (HCC)  PLAN:  Vitamin D Deficiency Melissa Rasmussen  was informed that low vitamin D levels contributes to fatigue and are associated with obesity, breast, and colon cancer. Melissa Rasmussen agrees to start prescription Vit D 50,000 IU every week #4 with no refills. She will follow up for routine testing of vitamin D, at least 2-3 times per year. She was informed of the risk of over-replacement of vitamin D and agrees to not increase her dose unless she discusses this with [name] first. Melissa Rasmussen agrees to follow up with our clinic in 2 weeks.  Insulin Resistance Melissa Rasmussen will continue to work on weight loss, exercise, and decreasing simple carbohydrates in her diet to help decrease the risk of diabetes. We dicussed metformin including benefits and risks. She was informed that eating too many simple carbohydrates or too many calories at one sitting increases the likelihood of GI side effects. We will repeat labs in 3 months. Melissa Rasmussen agrees to follow up with [name] as directed to monitor her progress.  Diabetes risk counseling Melissa Rasmussen was given extended (30 minutes) diabetes prevention counseling today. She is 48 y.o. female and has risk factors for diabetes including obesity and insulin resistance. We discussed intensive lifestyle modifications today with an emphasis on weight loss as well as increasing exercise and decreasing simple carbohydrates in her diet.  Obesity Melissa Rasmussen is currently in the action stage of change. As such, her goal is to continue with weight loss efforts She has agreed to keep a food journal with 650-750 calories and 55+ grams of protein for breakfast and lunch daily, and follow the Category 2 plan for dinner  Melissa Rasmussen has been instructed to work up to a goal of 150 minutes of combined cardio and strengthening exercise per week for weight loss and overall health benefits. We discussed the following Behavioral Modification Strategies today: increasing lean protein intake, increasing vegetables and work on meal planning and easy cooking plans, keeping healthy  foods in the home, and planning for success   Melissa Rasmussen has agreed to follow up with our clinic in 2 weeks. She was informed of the importance of frequent follow up visits to maximize her success with intensive lifestyle modifications for her multiple health conditions.  ALLERGIES: No Known Allergies  MEDICATIONS: Current Outpatient Medications on File Prior to Visit  Medication Sig Dispense Refill  . loratadine (CLARITIN) 10 MG tablet Take 10 mg by mouth daily as needed for allergies.    . mometasone (NASONEX) 50 MCG/ACT nasal spray Place 2 sprays into the nose daily.    . Multiple Vitamins-Minerals (MULTIVITAMIN GUMMIES ADULTS PO) Take by mouth.     No current facility-administered medications on file prior to visit.     PAST MEDICAL HISTORY: Past Medical History:  Diagnosis Date  . Allergies   . Anemia   . Back pain with sciatica   . Bilateral swelling of feet and ankles   . Knee pain   . Vitamin D deficiency     PAST SURGICAL HISTORY: Past Surgical History:  Procedure Laterality Date  . NOSE SURGERY  8/06 or 07  . TUBAL LIGATION  12/2004    SOCIAL HISTORY: Social History   Tobacco Use  . Smoking status: Never Smoker  Substance Use Topics  . Alcohol use: Yes    Comment: occ  . Drug use: No    FAMILY HISTORY: Family History  Problem Relation Age of Onset  . Hypertension Mother   . Depression Mother   . Obesity Mother   . Alcoholism Father     ROS: Review of Systems  Constitutional: Positive for malaise/fatigue. Negative for weight loss.  Gastrointestinal: Negative for nausea and vomiting.  Genitourinary: Negative for frequency.  Musculoskeletal:       Negative muscle weakness  Endo/Heme/Allergies: Negative for polydipsia.    PHYSICAL EXAM: Blood pressure 121/82, pulse 85, temperature 98 F (36.7 C), temperature source Oral, height 5\' 3"  (1.6 m), weight 267 lb (121.1 kg), SpO2 100 %. Body mass index is 47.3 kg/m. Physical Exam Vitals signs  reviewed.  Constitutional:      Appearance: Normal appearance. She is obese.  Cardiovascular:     Rate and Rhythm: Normal rate.     Pulses: Normal pulses.  Pulmonary:     Effort: Pulmonary effort is normal.     Breath sounds: Normal breath sounds.  Musculoskeletal: Normal range of motion.  Skin:    General: Skin is warm and dry.  Neurological:     Mental Status: She is alert and oriented to person, place, and time.  Psychiatric:        Mood and Affect: Mood normal.        Behavior: Behavior normal.     RECENT LABS AND TESTS: BMET    Component Value Date/Time   NA 139 01/17/2017 0235   K 4.0 01/17/2017 0235   CL 107 01/17/2017 0235   CO2 24 01/17/2017 0235   GLUCOSE 138 (H) 01/17/2017 0235   BUN 7 01/17/2017 0235   CREATININE 0.81 01/17/2017 0235   CALCIUM 9.0 01/17/2017 0235   GFRNONAA >60 01/17/2017 0235   GFRAA >60 01/17/2017 0235   No  results found for: HGBA1C Lab Results  Component Value Date   INSULIN 10.1 08/23/2019   CBC    Component Value Date/Time   WBC 8.3 01/17/2017 0235   RBC 3.81 (L) 01/17/2017 0235   HGB 11.0 (L) 01/17/2017 0235   HCT 34.4 (L) 01/17/2017 0235   PLT 325 01/17/2017 0235   MCV 90.3 01/17/2017 0235   MCH 28.9 01/17/2017 0235   MCHC 32.0 01/17/2017 0235   RDW 14.1 01/17/2017 0235   Iron/TIBC/Ferritin/ %Sat No results found for: IRON, TIBC, FERRITIN, IRONPCTSAT Lipid Panel  No results found for: CHOL, TRIG, HDL, CHOLHDL, VLDL, LDLCALC, LDLDIRECT Hepatic Function Panel  No results found for: PROT, ALBUMIN, AST, ALT, ALKPHOS, BILITOT, BILIDIR, IBILI    Component Value Date/Time   TSH 1.900 08/23/2019 1010      OBESITY BEHAVIORAL INTERVENTION VISIT  Today's visit was # 2   Starting weight: 267 lbs Starting date: 08/23/2019 Today's weight : 267 lbs  Today's date: 09/06/2019 Total lbs lost to date: 0    ASK: We discussed the diagnosis of obesity with Melissa Rasmussen today and Melissa Rasmussen agreed to give us permission to discuss  obesity behavioral modification therapy today.  ASSESS: Melissa Rasmussen has the diagnosis of obesity and her BMI today is 47.31 Melissa Rasmussen is in the action stage of change   ADVISE: Melissa Rasmussen was educated on the multiple health risks of obesity as well as the benefit of weight loss to improve her health. She was advised of the need for long term treatment and the importance of lifestyle modifications to improve her current health and to decrease her risk of future health problems.  AGREE: Multiple dietary modification options and treatment options were discussed and  Josiane agreed to follow the recommendations documented in the above note.  ARRANGE: Melissa Rasmussen was educated on the importance of frequent visits to treat obesity as outlined per CMS and USPSTF guidelines and agreed to schedule her next follow up appointment today.  I, Burt KnackSharon Martin, am acting as transcriptionist for Debbra RidingAlexandria Kadolph, MD  I have reviewed the above documentation for accuracy and completeness, and I agree with the above. - Debbra RidingAlexandria Kadolph, MD

## 2019-09-15 ENCOUNTER — Encounter (INDEPENDENT_AMBULATORY_CARE_PROVIDER_SITE_OTHER): Payer: Self-pay | Admitting: Bariatrics

## 2019-09-20 ENCOUNTER — Ambulatory Visit (INDEPENDENT_AMBULATORY_CARE_PROVIDER_SITE_OTHER): Payer: BC Managed Care – PPO | Admitting: Bariatrics

## 2019-10-31 ENCOUNTER — Ambulatory Visit (INDEPENDENT_AMBULATORY_CARE_PROVIDER_SITE_OTHER): Payer: BC Managed Care – PPO | Admitting: Bariatrics

## 2019-11-07 ENCOUNTER — Encounter (INDEPENDENT_AMBULATORY_CARE_PROVIDER_SITE_OTHER): Payer: Self-pay | Admitting: Bariatrics

## 2019-11-07 ENCOUNTER — Ambulatory Visit (INDEPENDENT_AMBULATORY_CARE_PROVIDER_SITE_OTHER): Payer: BC Managed Care – PPO | Admitting: Bariatrics

## 2019-11-07 ENCOUNTER — Other Ambulatory Visit: Payer: Self-pay

## 2019-11-07 VITALS — BP 126/80 | HR 97 | Temp 98.0°F | Ht 63.0 in | Wt 262.0 lb

## 2019-11-07 DIAGNOSIS — Z6841 Body Mass Index (BMI) 40.0 and over, adult: Secondary | ICD-10-CM

## 2019-11-07 DIAGNOSIS — E559 Vitamin D deficiency, unspecified: Secondary | ICD-10-CM | POA: Diagnosis not present

## 2019-11-07 DIAGNOSIS — F3289 Other specified depressive episodes: Secondary | ICD-10-CM

## 2019-11-07 DIAGNOSIS — Z9189 Other specified personal risk factors, not elsewhere classified: Secondary | ICD-10-CM | POA: Diagnosis not present

## 2019-11-07 DIAGNOSIS — E661 Drug-induced obesity: Secondary | ICD-10-CM

## 2019-11-07 MED ORDER — BUPROPION HCL ER (SR) 150 MG PO TB12: 150.0000 mg | ORAL_TABLET | Freq: Every day | ORAL | 0 refills | Status: DC

## 2019-11-07 MED ORDER — VITAMIN D (ERGOCALCIFEROL) 1.25 MG (50000 UNIT) PO CAPS: 50000.0000 [IU] | ORAL_CAPSULE | ORAL | 0 refills | Status: DC

## 2019-11-08 ENCOUNTER — Encounter (INDEPENDENT_AMBULATORY_CARE_PROVIDER_SITE_OTHER): Payer: Self-pay | Admitting: Bariatrics

## 2019-11-08 DIAGNOSIS — E661 Drug-induced obesity: Secondary | ICD-10-CM | POA: Insufficient documentation

## 2019-11-08 NOTE — Progress Notes (Signed)
Office: [phone]  /  Fax: [phone]   HPI:   Chief Complaint: OBESITY Reizel is here to discuss her progress with her obesity treatment plan. She is following a lower carbohydrate, vegetable and lean protein rich diet plan and is following her eating plan approximately 90% of the time. She states she is exercising 0 minutes 0 times per week. Anzlee is down 5 lbs. Her last visit was on 09/06/2019. She is not struggling except for some minor stress eating. Her weight is 262 lb (118.8 kg) today and has had a weight loss of 5 pounds over a period of 2 months since her last visit. She has lost 5 lbs since starting treatment with [name].  Vitamin D deficiency Falesha has a diagnosis of Vitamin D deficiency. Last Vitamin D 22.6 on 08/23/2019. She is currently taking prescription Vit D and denies nausea, vomiting or muscle weakness.  At risk for osteopenia and osteoporosis Willamina is at higher risk of osteopenia and osteoporosis due to Vitamin D deficiency.   Depression with emotional eating behaviors Blimi is struggling with emotional eating and using food for comfort to the extent that it is negatively impacting her health. She often snacks when she is not hungry. Haely sometimes feels she is out of control and then feels guilty that she made poor food choices. She has been working on behavior modification techniques to help reduce her emotional eating and has been somewhat successful. Lynda is on Wellbutrin and reports no contraindications. She shows no sign of suicidal or homicidal ideations.  Depression screen [facility] 2/9 08/23/2019  Decreased Interest 1  Down, Depressed, Hopeless 0  PHQ - 2 Score 1  Altered sleeping 2  Tired, decreased energy 1  Change in appetite 3  Feeling bad or failure about yourself  1  Trouble concentrating 2  Moving slowly or fidgety/restless 0  Suicidal thoughts 0  PHQ-9 Score 10  Difficult doing work/chores Not difficult at all   ASSESSMENT AND  PLAN:  Vitamin D deficiency - Plan: Vitamin D, Ergocalciferol, (DRISDOL) 1.25 MG (50000 UT) CAPS capsule  Other depression, emotional eating   At risk for osteoporosis  Class 3 severe obesity with serious comorbidity and body mass index (BMI) of 45.0 to 49.9 in adult, unspecified obesity type (HCC)  PLAN:  Vitamin D Deficiency Mikala was informed that low Vitamin D levels contributes to fatigue and are associated with obesity, breast, and colon cancer. Sghe agrees to continue to take prescription Vit D @ 50,000 IU every week #4 with 0 refills and will follow-up for routine testing of Vitamin D, at least 2-3 times per year. She was informed of the risk of over-replacement of Vitamin D and agrees to not increase her dose unless she discusses this with [name] first. Emnet agrees to follow-up with our clinic in 2-4 weeks.  At risk for osteopenia and osteoporosis Kirsti was given extended  (15 minutes) osteoporosis prevention counseling today. Yoshi is at risk for osteopenia and osteoporosis due to her Vitamin D deficiency. She was encouraged to take her Vitamin D and follow her higher calcium diet and increase strengthening exercise to help strengthen her bones and decrease her risk of osteopenia and osteoporosis.  Emotional Eating Behaviors (other depression) Behavior modification techniques were discussed today to help Jiya deal with her emotional/non-hunger eating behaviors. Hadlei was given a prescription for Wellbutrin 150 mg 1 PO daily #30 with 0 refills. She agrees to follow-up with our clinic in 2-4 weeks.  Obesity Chesney is currently in  the action stage of change. As such, her goal is to continue with weight loss efforts. She has agreed to follow a lower carbohydrate, vegetable and lean protein rich diet plan. Autumnrose will increase her water intake (discussed water application), will increase her protein, and will work on meal planning. Aiman has been instructed to do U-tube exercises  10 minutes 2 times per week for weight loss and overall health benefits. We discussed the following Behavioral Modification Strategies today: increasing lean protein intake, decreasing simple carbohydrates, increasing vegetables, increase H20 intake, decrease eating out, no skipping meals, work on meal planning and easy cooking plans, and keeping healthy foods in the home.  Taeja has agreed to follow-up with our clinic in 2-4 weeks. She was informed of the importance of frequent follow-up visits to maximize her success with intensive lifestyle modifications for her multiple health conditions.  ALLERGIES: No Known Allergies  MEDICATIONS: Current Outpatient Medications on File Prior to Visit  Medication Sig Dispense Refill   loratadine (CLARITIN) 10 MG tablet Take 10 mg by mouth daily as needed for allergies.     mometasone (NASONEX) 50 MCG/ACT nasal spray Place 2 sprays into the nose daily.     Multiple Vitamins-Minerals (MULTIVITAMIN GUMMIES ADULTS PO) Take by mouth.     No current facility-administered medications on file prior to visit.     PAST MEDICAL HISTORY: Past Medical History:  Diagnosis Date   Allergies    Anemia    Back pain with sciatica    Bilateral swelling of feet and ankles    Knee pain    Vitamin D deficiency     PAST SURGICAL HISTORY: Past Surgical History:  Procedure Laterality Date   NOSE SURGERY  8/06 or 07   TUBAL LIGATION  12/2004    SOCIAL HISTORY: Social History   Tobacco Use   Smoking status: Never Smoker   Smokeless tobacco: Never Used  Substance Use Topics   Alcohol use: Yes    Comment: occ   Drug use: No    FAMILY HISTORY: Family History  Problem Relation Age of Onset   Hypertension Mother    Depression Mother    Obesity Mother    Alcoholism Father    ROS: Review of Systems  Gastrointestinal: Negative for nausea and vomiting.  Musculoskeletal:       Negative for muscle weakness.  Psychiatric/Behavioral:  Positive for depression (emotional eating). Negative for suicidal ideas.       Negative for homicidal ideas.   PHYSICAL EXAM: Blood pressure 126/80, pulse 97, temperature 98 F (36.7 C), height 5\' 3"  (1.6 m), weight 262 lb (118.8 kg), SpO2 98 %. Body mass index is 46.41 kg/m. Physical Exam Vitals signs reviewed.  Constitutional:      Appearance: Normal appearance. She is obese.  Cardiovascular:     Rate and Rhythm: Normal rate.     Pulses: Normal pulses.  Pulmonary:     Effort: Pulmonary effort is normal.     Breath sounds: Normal breath sounds.  Musculoskeletal: Normal range of motion.  Skin:    General: Skin is warm and dry.  Neurological:     Mental Status: She is alert and oriented to person, place, and time.  Psychiatric:        Behavior: Behavior normal.   RECENT LABS AND TESTS: BMET    Component Value Date/Time   NA 139 01/17/2017 0235   K 4.0 01/17/2017 0235   CL 107 01/17/2017 0235   CO2 24 01/17/2017 0235  GLUCOSE 138 (H) 01/17/2017 0235   BUN 7 01/17/2017 0235   CREATININE 0.81 01/17/2017 0235   CALCIUM 9.0 01/17/2017 0235   GFRNONAA >60 01/17/2017 0235   GFRAA >60 01/17/2017 0235   No results found for: HGBA1C Lab Results  Component Value Date   INSULIN 10.1 08/23/2019   CBC    Component Value Date/Time   WBC 8.3 01/17/2017 0235   RBC 3.81 (L) 01/17/2017 0235   HGB 11.0 (L) 01/17/2017 0235   HCT 34.4 (L) 01/17/2017 0235   PLT 325 01/17/2017 0235   MCV 90.3 01/17/2017 0235   MCH 28.9 01/17/2017 0235   MCHC 32.0 01/17/2017 0235   RDW 14.1 01/17/2017 0235   Iron/TIBC/Ferritin/ %Sat No results found for: IRON, TIBC, FERRITIN, IRONPCTSAT Lipid Panel  No results found for: CHOL, TRIG, HDL, CHOLHDL, VLDL, LDLCALC, LDLDIRECT Hepatic Function Panel  No results found for: PROT, ALBUMIN, AST, ALT, ALKPHOS, BILITOT, BILIDIR, IBILI    Component Value Date/Time   TSH 1.900 08/23/2019 1010   Results for Lolita CramLEWIS, Lailah (MRN [id number]) as of 11/08/2019  06:49  Ref. Range 08/23/2019 10:10  Vitamin D, 25-Hydroxy Latest Ref Range: 30.0 - 100.0 ng/mL 22.6 (L)   OBESITY BEHAVIORAL INTERVENTION VISIT  Today's visit was #3  Starting weight: 267 lbs Starting date: 08/23/2019 Today's weight: 262 lbs  Today's date: 11/07/2019 Total lbs lost to date: 5     11/07/2019  Height 5\' 3"  (1.6 m)  Weight 262 lb (118.8 kg)  BMI (Calculated) 46.42  BLOOD PRESSURE - SYSTOLIC 126  BLOOD PRESSURE - DIASTOLIC 80   Body Fat % 53.5 %  Total Body Water (lbs) 89.4 lbs   ASK: We discussed the diagnosis of obesity with Lolita CramLynita Paro today and Ellamae agreed to give us permission to discuss obesity behavioral modification therapy today.  ASSESS: Irena ReichmannLynita has the diagnosis of obesity and her BMI today is 46.6. Irena ReichmannLynita is in the action stage of change.   ADVISE: Irena ReichmannLynita was educated on the multiple health risks of obesity as well as the benefit of weight loss to improve her health. She was advised of the need for long term treatment and the importance of lifestyle modifications to improve her current health and to decrease her risk of future health problems.  AGREE: Multiple dietary modification options and treatment options were discussed and  Kilea agreed to follow the recommendations documented in the above note.  ARRANGE: Irena ReichmannLynita was educated on the importance of frequent visits to treat obesity as outlined per CMS and USPSTF guidelines and agreed to schedule her next follow up appointment today.  Fernanda DrumI, Denise Haag, am acting as Energy managertranscriptionist for [employer] Energy[employer]el Brown, DO  I have reviewed the above documentation for accuracy and completeness, and I agree with the above. -Corinna CapraAngel Brown, DO

## 2019-12-07 ENCOUNTER — Ambulatory Visit (INDEPENDENT_AMBULATORY_CARE_PROVIDER_SITE_OTHER): Payer: BC Managed Care – PPO | Admitting: Bariatrics

## 2019-12-11 ENCOUNTER — Encounter (INDEPENDENT_AMBULATORY_CARE_PROVIDER_SITE_OTHER): Payer: Self-pay | Admitting: Bariatrics

## 2019-12-12 ENCOUNTER — Ambulatory Visit (INDEPENDENT_AMBULATORY_CARE_PROVIDER_SITE_OTHER): Payer: BC Managed Care – PPO | Admitting: Bariatrics

## 2019-12-12 ENCOUNTER — Other Ambulatory Visit: Payer: Self-pay

## 2019-12-12 VITALS — BP 121/84 | HR 93 | Temp 98.3°F | Ht 63.0 in | Wt 258.0 lb

## 2019-12-12 DIAGNOSIS — E559 Vitamin D deficiency, unspecified: Secondary | ICD-10-CM | POA: Diagnosis not present

## 2019-12-12 DIAGNOSIS — F3289 Other specified depressive episodes: Secondary | ICD-10-CM

## 2019-12-12 DIAGNOSIS — Z6841 Body Mass Index (BMI) 40.0 and over, adult: Secondary | ICD-10-CM | POA: Diagnosis not present

## 2019-12-12 MED ORDER — BUPROPION HCL ER (SR) 150 MG PO TB12: 150.0000 mg | ORAL_TABLET | Freq: Every day | ORAL | 0 refills | Status: AC

## 2019-12-12 MED ORDER — VITAMIN D (ERGOCALCIFEROL) 1.25 MG (50000 UNIT) PO CAPS: 50000.0000 [IU] | ORAL_CAPSULE | ORAL | 0 refills | Status: DC

## 2019-12-12 MED ORDER — BUPROPION HCL ER (SR) 150 MG PO TB12: 150.0000 mg | ORAL_TABLET | Freq: Every day | ORAL | 0 refills | Status: DC

## 2019-12-12 MED ORDER — VITAMIN D (ERGOCALCIFEROL) 1.25 MG (50000 UNIT) PO CAPS: 50000.0000 [IU] | ORAL_CAPSULE | ORAL | 0 refills | Status: AC

## 2019-12-13 NOTE — Progress Notes (Signed)
Chief Complaint:   OBESITY Melissa Rasmussen is here to discuss her progress with her obesity treatment plan along with follow-up of her obesity related diagnoses. Melissa Rasmussen is following a lower carbohydrate, vegetable and lean protein rich diet plan and states she is following her eating plan approximately 50% of the time. Melissa Rasmussen states she is exercising 0 minutes 0 times per week.  Today's visit was #: 4 Starting weight: 267 lbs Starting date: 08/23/2019 Today's weight: 258 lbs Today's date: 12/12/2019 Total lbs lost to date: 9 Total lbs lost since last in-office visit: 4  Interim History: Melissa Rasmussen is down 4 lbs and doing well overall. She had some carbohydrates and other food, but got back on track.  Subjective:   Vitamin D deficiency. Melissa Rasmussen has been prescribed high dose Vitamin D. Last Vitamin D level was 22.6 on 08/23/2019.  Other depression, emotional eating. Melissa Rasmussen is struggling with emotional eating and using food for comfort to the extent that it is negatively impacting her health. She often snacks when she is not hungry. Melissa Rasmussen sometimes feels she is out of control and then feels guilty that she made poor food choices. She has been working on behavior modification techniques to help reduce her emotional eating and has been somewhat successful. She shows no sign of suicidal or homicidal ideations. Melissa Rasmussen is on Wellbutrin.  Assessment/Plan:   Vitamin D deficiency. Low Vitamin D level contributes to fatigue and are associated with obesity, breast, and colon cancer. She agrees to take prescription Vitamin D, Ergocalciferol, (DRISDOL) 1.25 MG (50000 UNIT) CAPS capsule every week #4 with 0 refills and will follow-up for routine testing of vitamin D, at least 2-3 times per year to avoid over-replacement.  Other depression, emotional eating. Behavior modification techniques were discussed today to help Melissa Rasmussen deal with her emotional/non-hunger eating behaviors.  Orders and follow up as  documented in patient record. buPROPion (WELLBUTRIN SR) 150 MG 12 hr tablet 1 PO daily #30 with 0 refills was prescribed.  Class 3 severe obesity with serious comorbidity and body mass index (BMI) of 45.0 to 49.9 in adult, unspecified obesity type ([place]).  Melissa Rasmussen is currently in the action stage of change. As such, her goal is to continue with weight loss efforts. She has agreed to following a lower carbohydrate, vegetable and lean protein rich diet plan.   She will work on meal planning, intentional/mindful eating, and increasing her water intake.  We discussed the following exercise goals today: Melissa Rasmussen will ponder exercise (U-tube).  We discussed the following behavioral modification strategies today: increasing lean protein intake, decreasing simple carbohydrates, increasing vegetables, increasing water intake, decreasing eating out, no skipping meals, meal planning and cooking strategies, keeping healthy foods in the home and planning for success.  Melissa Rasmussen has agreed to follow-up with our clinic in 2 weeks. She was informed of the importance of frequent follow-up visits to maximize her success with intensive lifestyle modifications for her multiple health conditions.   Objective:   Blood pressure 121/84, pulse 93, temperature 98.3 F (36.8 C), height 5\' 3"  (1.6 m), weight 258 lb (117 kg), SpO2 99 %. Body mass index is 45.7 kg/m.  General: Cooperative, alert, well developed, in no acute distress. HEENT: Conjunctivae and lids unremarkable. Neck: No thyromegaly.  Cardiovascular: Regular rhythm.  Lungs: Normal work of breathing. Extremities: No edema.  Neurologic: No focal deficits.   Lab Results  Component Value Date   CREATININE 0.81 01/17/2017   BUN 7 01/17/2017   NA 139 01/17/2017  K 4.0 01/17/2017   CL 107 01/17/2017   CO2 24 01/17/2017   No results found for: ALT, AST, GGT, ALKPHOS, BILITOT No results found for: HGBA1C Lab Results  Component Value Date   INSULIN  10.1 08/23/2019   Lab Results  Component Value Date   TSH 1.900 08/23/2019   No results found for: CHOL, HDL, LDLCALC, LDLDIRECT, TRIG, CHOLHDL Lab Results  Component Value Date   WBC 8.3 01/17/2017   HGB 11.0 (L) 01/17/2017   HCT 34.4 (L) 01/17/2017   MCV 90.3 01/17/2017   PLT 325 01/17/2017   No results found for: IRON, TIBC, FERRITIN  Attestation Statements:   Reviewed by clinician on day of visit: allergies, medications, problem list, medical history, surgical history, family history, social history, and previous encounter notes.  Fernanda Drum, am acting as Energy manager for [employer], DO   I have reviewed the above documentation for accuracy and completeness, and I agree with the above. Corinna Capra, DO

## 2019-12-16 ENCOUNTER — Encounter (INDEPENDENT_AMBULATORY_CARE_PROVIDER_SITE_OTHER): Payer: Self-pay | Admitting: Bariatrics

## 2020-01-08 ENCOUNTER — Encounter (INDEPENDENT_AMBULATORY_CARE_PROVIDER_SITE_OTHER): Payer: Self-pay | Admitting: Bariatrics

## 2020-01-08 ENCOUNTER — Other Ambulatory Visit: Payer: Self-pay

## 2020-01-08 ENCOUNTER — Ambulatory Visit (INDEPENDENT_AMBULATORY_CARE_PROVIDER_SITE_OTHER): Payer: BC Managed Care – PPO | Admitting: Bariatrics

## 2020-01-08 VITALS — BP 126/83 | HR 87 | Temp 98.5°F | Ht 63.0 in | Wt 263.0 lb

## 2020-01-08 DIAGNOSIS — F3289 Other specified depressive episodes: Secondary | ICD-10-CM

## 2020-01-08 DIAGNOSIS — Z6841 Body Mass Index (BMI) 40.0 and over, adult: Secondary | ICD-10-CM

## 2020-01-08 DIAGNOSIS — E559 Vitamin D deficiency, unspecified: Secondary | ICD-10-CM | POA: Diagnosis not present

## 2020-01-09 ENCOUNTER — Encounter (INDEPENDENT_AMBULATORY_CARE_PROVIDER_SITE_OTHER): Payer: Self-pay | Admitting: Bariatrics

## 2020-01-09 NOTE — Progress Notes (Signed)
Chief Complaint:   OBESITY Melissa Rasmussen is here to discuss her progress with her obesity treatment plan along with follow-up of her obesity related diagnoses. Melissa Rasmussen is following a lower carbohydrate, vegetable and lean protein rich diet plan and states she is following her eating plan approximately 0% of the time. Melissa Rasmussen states she is exercising 0 minutes 0 times per week.  Today's visit was #: 5 Starting weight: 267 lbs Starting date: 08/23/2019 Today's weight: 263 lbs Today's date: 01/08/2020 Total lbs lost to date: 4 Total lbs lost since last in-office visit: 0  Interim History: Melissa Rasmussen has gone up 5 lbs. She is ready to get back on track.  Subjective:   Other depression, with emotional eating. Melissa Rasmussen is struggling with emotional eating and using food for comfort to the extent that it is negatively impacting her health. She has been working on behavior modification techniques to help reduce her emotional eating and has been somewhat successful. She shows no sign of suicidal or homicidal ideations. Melissa Rasmussen is not taking Wellbutrin.  Vitamin D deficiency. Melissa Rasmussen is taking Vitamin D. Last Vitamin D level 22.6 on 08/23/2019.  Assessment/Plan:   Other depression, with emotional eating. Behavior modification techniques were discussed today to help Melissa Rasmussen deal with her emotional/non-hunger eating behaviors.  Orders and follow up as documented in patient record. She will start taking her Wellbutrin.   Vitamin D deficiency. Low Vitamin D level contributes to fatigue and are associated with obesity, breast, and colon cancer. She agrees to continue to take Vitamin D and will follow-up for routine testing of Vitamin D, at least 2-3 times per year to avoid over-replacement.  Class 3 severe obesity with serious comorbidity and body mass index (BMI) of 45.0 to 49.9 in adult, unspecified obesity type (HCC).  Melissa Rasmussen is currently in the action stage of change. As such, her goal is to continue with  weight loss efforts. She has agreed to following a lower carbohydrate, vegetable and lean protein rich diet plan.   She will work on meal planning, will resume low carb diet, and will increase her water intake and may use a bouillon cube.  Exercise goals: Melissa Rasmussen has increased knee pain (stop treadmill) and will do recumbent bike.  Behavioral modification strategies: increasing lean protein intake, decreasing simple carbohydrates, increasing vegetables, increasing water intake, decreasing eating out, no skipping meals, meal planning and cooking strategies, keeping healthy foods in the home and planning for success.  Melissa Rasmussen has agreed to follow-up with our clinic in 2 weeks. She was informed of the importance of frequent follow-up visits to maximize her success with intensive lifestyle modifications for her multiple health conditions.   Objective:   Blood pressure 126/83, pulse 87, temperature 98.5 F (36.9 C), height 5\' 3"  (1.6 m), weight 263 lb (119.3 kg), SpO2 100 %. Body mass index is 46.59 kg/m.  General: Cooperative, alert, well developed, in no acute distress. HEENT: Conjunctivae and lids unremarkable. Cardiovascular: Regular rhythm.  Lungs: Normal work of breathing. Neurologic: No focal deficits.   Lab Results  Component Value Date   CREATININE 0.81 01/17/2017   BUN 7 01/17/2017   NA 139 01/17/2017   K 4.0 01/17/2017   CL 107 01/17/2017   CO2 24 01/17/2017   No results found for: ALT, AST, GGT, ALKPHOS, BILITOT No results found for: HGBA1C Lab Results  Component Value Date   INSULIN 10.1 08/23/2019   Lab Results  Component Value Date   TSH 1.900 08/23/2019   No results found  for: CHOL, HDL, LDLCALC, LDLDIRECT, TRIG, CHOLHDL Lab Results  Component Value Date   WBC 8.3 01/17/2017   HGB 11.0 (L) 01/17/2017   HCT 34.4 (L) 01/17/2017   MCV 90.3 01/17/2017   PLT 325 01/17/2017   No results found for: IRON, TIBC, FERRITIN  Attestation Statements:   Reviewed by  clinician on day of visit: allergies, medications, problem list, medical history, surgical history, family history, social history, and previous encounter notes.  Time spent on visit including pre-visit chart review and post-visit care was 20 minutes.   Migdalia Dk, am acting as Location manager for [employer], DO   I have reviewed the above documentation for accuracy and completeness, and I agree with the above. Jearld Lesch, DO

## 2020-01-22 DIAGNOSIS — F330 Major depressive disorder, recurrent, mild: Secondary | ICD-10-CM | POA: Diagnosis not present

## 2020-01-22 DIAGNOSIS — R635 Abnormal weight gain: Secondary | ICD-10-CM | POA: Diagnosis not present

## 2020-01-22 DIAGNOSIS — M130 Polyarthritis, unspecified: Secondary | ICD-10-CM | POA: Diagnosis not present

## 2020-01-22 DIAGNOSIS — F341 Dysthymic disorder: Secondary | ICD-10-CM | POA: Diagnosis not present

## 2020-01-30 ENCOUNTER — Ambulatory Visit (INDEPENDENT_AMBULATORY_CARE_PROVIDER_SITE_OTHER): Payer: BC Managed Care – PPO | Admitting: Family Medicine

## 2020-02-08 ENCOUNTER — Ambulatory Visit (INDEPENDENT_AMBULATORY_CARE_PROVIDER_SITE_OTHER): Payer: BC Managed Care – PPO | Admitting: Family Medicine

## 2020-02-29 ENCOUNTER — Ambulatory Visit (INDEPENDENT_AMBULATORY_CARE_PROVIDER_SITE_OTHER): Payer: BC Managed Care – PPO | Admitting: Family Medicine

## 2020-03-05 ENCOUNTER — Emergency Department (HOSPITAL_COMMUNITY): Payer: BC Managed Care – PPO

## 2020-03-05 ENCOUNTER — Emergency Department (HOSPITAL_COMMUNITY)
Admission: EM | Admit: 2020-03-05 | Discharge: 2020-03-05 | Disposition: A | Discharge status: Home / Self Care | Payer: BC Managed Care – PPO | Attending: Emergency Medicine | Admitting: Emergency Medicine

## 2020-03-05 ENCOUNTER — Other Ambulatory Visit: Payer: Self-pay

## 2020-03-05 ENCOUNTER — Encounter (HOSPITAL_COMMUNITY): Payer: Self-pay | Admitting: *Deleted

## 2020-03-05 DIAGNOSIS — R1012 Left upper quadrant pain: Secondary | ICD-10-CM | POA: Diagnosis not present

## 2020-03-05 DIAGNOSIS — R1013 Epigastric pain: Secondary | ICD-10-CM | POA: Diagnosis not present

## 2020-03-05 DIAGNOSIS — Z79899 Other long term (current) drug therapy: Secondary | ICD-10-CM | POA: Diagnosis not present

## 2020-03-05 DIAGNOSIS — R110 Nausea: Secondary | ICD-10-CM | POA: Diagnosis not present

## 2020-03-05 DIAGNOSIS — D259 Leiomyoma of uterus, unspecified: Secondary | ICD-10-CM | POA: Diagnosis not present

## 2020-03-05 LAB — CBC
HCT: 36.6 % (ref 36.0–46.0)
Hemoglobin: 11.4 g/dL — ABNORMAL LOW (ref 12.0–15.0)
MCH: 29.2 pg (ref 26.0–34.0)
MCHC: 31.1 g/dL (ref 30.0–36.0)
MCV: 93.6 fL (ref 80.0–100.0)
Platelets: 266 10*3/uL (ref 150–400)
RBC: 3.91 MIL/uL (ref 3.87–5.11)
RDW: 14.5 % (ref 11.5–15.5)
WBC: 5.9 10*3/uL (ref 4.0–10.5)
nRBC: 0 % (ref 0.0–0.2)

## 2020-03-05 LAB — COMPREHENSIVE METABOLIC PANEL
ALT: 15 U/L (ref 0–44)
AST: 19 U/L (ref 15–41)
Albumin: 4.1 g/dL (ref 3.5–5.0)
Alkaline Phosphatase: 50 U/L (ref 38–126)
Anion gap: 8 (ref 5–15)
BUN: 11 mg/dL (ref 6–20)
CO2: 26 mmol/L (ref 22–32)
Calcium: 9.2 mg/dL (ref 8.9–10.3)
Chloride: 109 mmol/L (ref 98–111)
Creatinine, Ser: 0.89 mg/dL (ref 0.44–1.00)
GFR calc Af Amer: >60 mL/min (ref >60)
GFR calc non Af Amer: >60 mL/min (ref >60)
Glucose, Bld: 113 mg/dL — ABNORMAL HIGH (ref 70–99)
Potassium: 3.8 mmol/L (ref 3.5–5.1)
Sodium: 143 mmol/L (ref 135–145)
Total Bilirubin: 0.7 mg/dL (ref 0.3–1.2)
Total Protein: 7.3 g/dL (ref 6.5–8.1)

## 2020-03-05 LAB — URINALYSIS, ROUTINE W REFLEX MICROSCOPIC
Bilirubin Urine: NEGATIVE
Glucose, UA: NEGATIVE mg/dL
Hgb urine dipstick: NEGATIVE
Ketones, ur: 5 mg/dL — AB
Leukocytes,Ua: NEGATIVE
Nitrite: NEGATIVE
Protein, ur: 30 mg/dL — AB
Specific Gravity, Urine: 1.033 — ABNORMAL HIGH (ref 1.005–1.030)
pH: 5 (ref 5.0–8.0)

## 2020-03-05 LAB — POC OCCULT BLOOD, ED: Fecal Occult Bld: NEGATIVE

## 2020-03-05 LAB — LIPASE, BLOOD: Lipase: 25 U/L (ref 11–51)

## 2020-03-05 LAB — I-STAT BETA HCG BLOOD, ED (MC, WL, AP ONLY): I-stat hCG, quantitative: <5 m[IU]/mL (ref <5)

## 2020-03-05 MED ORDER — SODIUM CHLORIDE 0.9% FLUSH: 3.0000 mL | Freq: Once | INTRAVENOUS | Status: DC

## 2020-03-05 MED ORDER — SODIUM CHLORIDE 0.9 % IV BOLUS
1000.0000 mL | Freq: Once | INTRAVENOUS | Status: AC
  Administered 2020-03-05: 1000 mL via INTRAVENOUS

## 2020-03-05 MED ORDER — DICYCLOMINE HCL 10 MG PO CAPS
10.0000 mg | ORAL_CAPSULE | Freq: Once | ORAL | Status: AC
  Administered 2020-03-05: 10 mg via ORAL
  Filled 2020-03-05: qty 1

## 2020-03-05 MED ORDER — ONDANSETRON HCL 4 MG/2ML IJ SOLN
4.0000 mg | Freq: Once | INTRAMUSCULAR | Status: AC
  Administered 2020-03-05: 4 mg via INTRAVENOUS
  Filled 2020-03-05: qty 2

## 2020-03-05 MED ORDER — IOHEXOL 300 MG/ML  SOLN
100.0000 mL | Freq: Once | INTRAMUSCULAR | Status: AC | PRN
  Administered 2020-03-05: 100 mL via INTRAVENOUS

## 2020-03-05 MED ORDER — SODIUM CHLORIDE (PF) 0.9 % IJ SOLN
INTRAMUSCULAR | Status: AC
  Filled 2020-03-05: qty 50

## 2020-03-05 MED ORDER — ONDANSETRON HCL 4 MG PO TABS: 4.0000 mg | ORAL_TABLET | Freq: Four times a day (QID) | ORAL | 0 refills | Status: AC

## 2020-03-05 MED ORDER — PANTOPRAZOLE SODIUM 40 MG IV SOLR
40.0000 mg | Freq: Once | INTRAVENOUS | Status: AC
  Administered 2020-03-05: 40 mg via INTRAVENOUS
  Filled 2020-03-05: qty 40

## 2020-03-05 MED ORDER — SUCRALFATE 1 GM/10ML PO SUSP
1.0000 g | Freq: Three times a day (TID) | ORAL | Status: DC
  Administered 2020-03-05: 1 g via ORAL
  Filled 2020-03-05: qty 10

## 2020-03-05 MED ORDER — PANTOPRAZOLE SODIUM 40 MG PO TBEC: 40.0000 mg | DELAYED_RELEASE_TABLET | Freq: Every day | ORAL | 0 refills | Status: AC

## 2020-03-05 MED ORDER — FAMOTIDINE IN NACL 20-0.9 MG/50ML-% IV SOLN
20.0000 mg | Freq: Once | INTRAVENOUS | Status: AC
  Administered 2020-03-05: 20 mg via INTRAVENOUS
  Filled 2020-03-05: qty 50

## 2020-03-05 NOTE — ED Triage Notes (Signed)
Abd pain and nausea for about a week.

## 2020-03-05 NOTE — Discharge Instructions (Signed)
Please take Protonix and Zofran as directed.  Please follow up with your primary doctor within the next 5-7 days.  If you do not have a primary care provider, information for a healthcare clinic has been provided for you to make arrangements for follow up care. Please return to the ER sooner if you have any new or worsening symptoms, or if you have any of the following symptoms:  Abdominal pain that does not go away.  You have a fever.  You keep throwing up (vomiting).  The pain is felt only in portions of the abdomen. Pain in the right side could possibly be appendicitis. In an adult, pain in the left lower portion of the abdomen could be colitis or diverticulitis.  You pass bloody or black tarry stools.  There is bright red blood in the stool.  The constipation stays for more than 4 days.  There is belly (abdominal) or rectal pain.  You do not seem to be getting better.  You have any questions or concerns.

## 2020-03-05 NOTE — ED Notes (Signed)
Pt ambulatory to restroom to provide UA with student nurse Carly

## 2020-03-05 NOTE — ED Provider Notes (Signed)
[facility]-EMERGENCY DEPT Provider Note   CSN: [id number] Arrival date & time: 03/05/20  1342     History Chief Complaint  Patient presents with  . Abdominal Pain  . Nausea    Melissa Rasmussen is a 49 y.o. female.  HPI   Pt is a 49 y/o F who presents to the ED today for eval of abd pain that started 1 week ago.   Sxs are intermittent. Describes the pain as cramping in nature. Pain located to the left upper part of the abdomen. Pain radiates to the right side of the abd and to the lower abd. She currently rates her pain at 5-6/10.  She reports associated nausea. Denies associated vomiting, diarrhea, constipated, dysuria, frequency, urgency.  States that her stool has been dark and almost black. She has tried Peptobismol and Miralax without significant improvement of symptoms.   States she drinks ETOH about once per month. Denies heavy NSAID use.   Past Medical History:  Diagnosis Date  . Allergies   . Anemia   . Back pain with sciatica   . Bilateral swelling of feet and ankles   . Knee pain   . Vitamin D deficiency     Patient Active Problem List   Diagnosis Date Noted  . Class 3 drug-induced obesity without serious comorbidity with body mass index (BMI) of 45.0 to 49.9 in adult (HCC) 11/08/2019  . Migraine 02/25/2017  . Sickle cell trait (HCC) 02/25/2017    Past Surgical History:  Procedure Laterality Date  . ABLATION    . NOSE SURGERY  8/06 or 07  . TUBAL LIGATION  12/2004     OB History    Gravida  4   Para      Term      Preterm      AB      Living        SAB      TAB      Ectopic      Multiple      Live Births              Family History  Problem Relation Age of Onset  . Hypertension Mother   . Depression Mother   . Obesity Mother   . Alcoholism Father     Social History   Tobacco Use  . Smoking status: Never Smoker  . Smokeless tobacco: Never Used  Substance Use Topics  . Alcohol use: Yes    Comment: occ   . Drug use: No    Home Medications Prior to Admission medications   Medication Sig Start Date End Date Taking? Authorizing Provider  Multiple Vitamins-Minerals (MULTIVITAMIN GUMMIES ADULTS PO) Take by mouth.   Yes [provider]  buPROPion (WELLBUTRIN SR) 150 MG 12 hr tablet Take 1 tablet (150 mg total) by mouth daily. Patient not taking: Reported on 03/05/2020 12/12/19   Corinna Capra A, DO  ondansetron (ZOFRAN) 4 MG tablet Take 1 tablet (4 mg total) by mouth every 6 (six) hours. 03/05/20   Bruna Dills S, PA-C  pantoprazole (PROTONIX) 40 MG tablet Take 1 tablet (40 mg total) by mouth daily for 14 days. 03/05/20 03/19/20  Zaiyah Sottile S, PA-C  Vitamin D, Ergocalciferol, (DRISDOL) 1.25 MG (50000 UNIT) CAPS capsule Take 1 capsule (50,000 Units total) by mouth every 7 (seven) days. Patient not taking: Reported on 03/05/2020 12/12/19   Corinna Capra A, DO    Allergies    Patient has no known allergies.  Review of Systems   Review of Systems  Constitutional: Negative for chills and fever.  HENT: Negative for ear pain and sore throat.   Eyes: Negative for visual disturbance.  Respiratory: Negative for cough and shortness of breath.   Cardiovascular: Negative for chest pain.  Gastrointestinal: Positive for abdominal pain and nausea. Negative for constipation, diarrhea and vomiting.  Genitourinary: Negative for dysuria and hematuria.  Musculoskeletal: Negative for back pain.  Skin: Negative for rash.  Neurological: Negative for headaches.  All other systems reviewed and are negative.   Physical Exam Updated Vital Signs BP 117/74   Pulse 70   Temp 99 F (37.2 C) (Oral)   Resp (!) 21   Ht 5\' 3"  (1.6 m)   Wt 117.9 kg   SpO2 100%   BMI 46.06 kg/m   Physical Exam Vitals and nursing note reviewed.  Constitutional:      General: She is not in acute distress.    Appearance: She is well-developed.  HENT:     Head: Normocephalic and atraumatic.  Eyes:     Conjunctiva/sclera:  Conjunctivae normal.  Cardiovascular:     Rate and Rhythm: Normal rate and regular rhythm.     Heart sounds: Normal heart sounds. No murmur.  Pulmonary:     Effort: Pulmonary effort is normal. No respiratory distress.     Breath sounds: Normal breath sounds. No wheezing, rhonchi or rales.  Abdominal:     General: Bowel sounds are normal.     Palpations: Abdomen is soft.     Tenderness: There is abdominal tenderness in the epigastric area and left upper quadrant. There is no right CVA tenderness, left CVA tenderness, guarding or rebound.  Genitourinary:    Comments: Chaperone present during DRE.  Dark brown stool noted in the rectal vault.  No hemorrhoids noted.  No bright red blood or melena noted. Musculoskeletal:     Cervical back: Neck supple.  Skin:    General: Skin is warm and dry.  Neurological:     Mental Status: She is alert.     ED Results / Procedures / Treatments   Labs (all labs ordered are listed, but only abnormal results are displayed) Labs Reviewed  COMPREHENSIVE METABOLIC PANEL - Abnormal; Notable for the following components:      Result Value   Glucose, Bld 113 (*)    All other components within normal limits  CBC - Abnormal; Notable for the following components:   Hemoglobin 11.4 (*)    All other components within normal limits  URINALYSIS, ROUTINE W REFLEX MICROSCOPIC - Abnormal; Notable for the following components:   APPearance TURBID (*)    Specific Gravity, Urine 1.033 (*)    Ketones, ur 5 (*)    Protein, ur 30 (*)    Bacteria, UA FEW (*)    All other components within normal limits  LIPASE, BLOOD  I-STAT BETA HCG BLOOD, ED ([facility], [facility], [facility] ONLY)  POC OCCULT BLOOD, ED    EKG EKG Interpretation  Date/Time:  Tuesday March 05 2020 18:51:12 EDT Ventricular Rate:  76 PR Interval:    QRS Duration: 82 QT Interval:  421 QTC Calculation: 474 R Axis:   46 Text Interpretation: Sinus rhythm no acute st/ts No old tracing to compare Confirmed by 01-26-2002 [phone]) on 03/05/2020 7:00:36 PM   Radiology CT ABDOMEN PELVIS W CONTRAST  Result Date: 03/05/2020 CLINICAL DATA:  Epigastric pain EXAM: CT ABDOMEN AND PELVIS WITH CONTRAST TECHNIQUE: Multidetector CT imaging of the abdomen  and pelvis was performed using the standard protocol following bolus administration of intravenous contrast. CONTRAST:  OMNIPAQUE IOHEXOL 300 MG/ML  SOLN COMPARISON:  None. FINDINGS: Lower chest: Lung bases are clear. No effusions. Heart is normal size. Hepatobiliary: No focal hepatic abnormality. Gallbladder unremarkable. Pancreas: No focal abnormality or ductal dilatation. Spleen: Normal size. Small low-density lesion in the superior spleen measures 14 mm, likely small cyst. Adrenals/Urinary Tract: No adrenal abnormality. No focal renal abnormality. No stones or hydronephrosis. Urinary bladder is unremarkable. Stomach/Bowel: Appendix is visualized and is normal. Stomach, large and small bowel grossly unremarkable. Vascular/Lymphatic: No evidence of aneurysm or adenopathy. Reproductive: Small enhancing areas within the uterus, likely small fibroids. No adnexal mass. Other: No free fluid or free air. Musculoskeletal: No acute bony abnormality. IMPRESSION: No acute findings in the abdomen or pelvis. Normal appendix. Small uterine fibroids. Electronically Signed   By: Charlett Nose M.D.   On: 03/05/2020 17:37    Procedures Procedures (including critical care time)  Medications Ordered in ED Medications  sodium chloride flush (NS) 0.9 % injection 3 mL (0 mLs Intravenous Hold 03/05/20 1622)  sucralfate (CARAFATE) 1 GM/10ML suspension 1 g (1 g Oral Given 03/05/20 1549)  pantoprazole (PROTONIX) injection 40 mg (40 mg Intravenous Given 03/05/20 1548)  ondansetron (ZOFRAN) injection 4 mg (4 mg Intravenous Given 03/05/20 1549)  famotidine (PEPCID) IVPB 20 mg premix (0 mg Intravenous Stopped 03/05/20 1739)  dicyclomine (BENTYL) capsule 10 mg (10 mg Oral Given 03/05/20 1746)  sodium  chloride 0.9 % bolus 1,000 mL (0 mLs Intravenous Stopped 03/05/20 1916)  iohexol (OMNIPAQUE) 300 MG/ML solution 100 mL (100 mLs Intravenous Contrast Given 03/05/20 1716)  sodium chloride (PF) 0.9 % injection (  Given 03/05/20 1921)    ED Course  I have reviewed the triage vital signs and the nursing notes.  Pertinent labs & imaging results that were available during my care of the patient were reviewed by me and considered in my medical decision making (see chart for details).    MDM Rules/Calculators/A&P                       49 year old female presenting for evaluation of epigastric abdominal pain ongoing for a week intermittently.  Is also associated with nausea.  Symptoms not improved with MiraLAX and Pepto-Bismol.  Today she is afebrile with normal vital signs.  She does have some epigastric and left upper quadrant abdominal tenderness on exam.  Abdomen is nonsurgical on initial evaluation.  Reviewed/interpreted labs CBC is without leukocytosis.  She does have mild anemia which appears chronic in nature and unchanged from prior. CMP is without gross electrolyte derangement.  Normal kidney and liver function. Lipase is negative Beta-hCG is negative Hemoccult is negative UA negative for UTI.  CT abdomen/pelvis was personally reviewed/interpreted -imaging did not show any evidence for acute process though did show history of uterine fibroids which I do not think are contributing to patient's symptoms today.  Discussed possible diagnosis of gastritis versus PUD.  Will treat with PPI.  We will also give Zofran for home.  Will give information for her to follow-up with GI should her symptoms persist and she require EGD. Patient is nontoxic, nonseptic appearing, in no apparent distress.  Patient does not meet the SIRS or Sepsis criteria.  On repeat exam patient does not have a surgical abdomin and there are no peritoneal signs.  No indication of appendicitis, bowel obstruction, bowel perforation,  cholecystitis, diverticulitis, PID or ectopic pregnancy.  I have also discussed reasons to return immediately to the ER.  Patient expresses understanding and agrees with plan.  Final Clinical Impression(s) / ED Diagnoses Final diagnoses:  Epigastric pain    Rx / DC Orders ED Discharge Orders         Ordered    pantoprazole (PROTONIX) 40 MG tablet  Daily     03/05/20 1918    ondansetron (ZOFRAN) 4 MG tablet  Every 6 hours     03/05/20 1918           Bishop Dublin 03/05/20 1922    Hayden Rasmussen, MD 03/06/20 1501

## 2020-04-03 DIAGNOSIS — Z6841 Body Mass Index (BMI) 40.0 and over, adult: Secondary | ICD-10-CM | POA: Diagnosis not present

## 2020-04-03 DIAGNOSIS — Z01419 Encounter for gynecological examination (general) (routine) without abnormal findings: Secondary | ICD-10-CM | POA: Diagnosis not present

## 2020-04-03 DIAGNOSIS — Z1211 Encounter for screening for malignant neoplasm of colon: Secondary | ICD-10-CM | POA: Diagnosis not present

## 2020-04-03 DIAGNOSIS — Z124 Encounter for screening for malignant neoplasm of cervix: Secondary | ICD-10-CM | POA: Diagnosis not present

## 2020-04-23 DIAGNOSIS — Z1211 Encounter for screening for malignant neoplasm of colon: Secondary | ICD-10-CM | POA: Diagnosis not present

## 2020-04-23 DIAGNOSIS — K5904 Chronic idiopathic constipation: Secondary | ICD-10-CM | POA: Diagnosis not present

## 2020-06-12 DIAGNOSIS — K621 Rectal polyp: Secondary | ICD-10-CM | POA: Diagnosis not present

## 2020-06-12 DIAGNOSIS — K6289 Other specified diseases of anus and rectum: Secondary | ICD-10-CM | POA: Diagnosis not present

## 2020-06-12 DIAGNOSIS — D128 Benign neoplasm of rectum: Secondary | ICD-10-CM | POA: Diagnosis not present

## 2020-06-12 DIAGNOSIS — Z1211 Encounter for screening for malignant neoplasm of colon: Secondary | ICD-10-CM | POA: Diagnosis not present

## 2020-07-21 DIAGNOSIS — Z20822 Contact with and (suspected) exposure to covid-19: Secondary | ICD-10-CM | POA: Diagnosis not present

## 2020-08-29 DIAGNOSIS — Z1231 Encounter for screening mammogram for malignant neoplasm of breast: Secondary | ICD-10-CM | POA: Diagnosis not present

## 2020-10-10 DIAGNOSIS — G43009 Migraine without aura, not intractable, without status migrainosus: Secondary | ICD-10-CM | POA: Diagnosis not present

## 2020-10-10 DIAGNOSIS — E559 Vitamin D deficiency, unspecified: Secondary | ICD-10-CM | POA: Diagnosis not present

## 2020-10-10 DIAGNOSIS — Z6841 Body Mass Index (BMI) 40.0 and over, adult: Secondary | ICD-10-CM | POA: Diagnosis not present

## 2020-10-10 DIAGNOSIS — E661 Drug-induced obesity: Secondary | ICD-10-CM | POA: Diagnosis not present

## 2020-10-10 DIAGNOSIS — D649 Anemia, unspecified: Secondary | ICD-10-CM | POA: Diagnosis not present

## 2020-11-21 DIAGNOSIS — Z13228 Encounter for screening for other metabolic disorders: Secondary | ICD-10-CM | POA: Diagnosis not present

## 2020-11-21 DIAGNOSIS — K5909 Other constipation: Secondary | ICD-10-CM | POA: Diagnosis not present

## 2020-11-21 DIAGNOSIS — D649 Anemia, unspecified: Secondary | ICD-10-CM | POA: Diagnosis not present

## 2020-11-21 DIAGNOSIS — Z6841 Body Mass Index (BMI) 40.0 and over, adult: Secondary | ICD-10-CM | POA: Diagnosis not present

## 2020-12-04 DIAGNOSIS — Z6841 Body Mass Index (BMI) 40.0 and over, adult: Secondary | ICD-10-CM | POA: Diagnosis not present

## 2020-12-04 DIAGNOSIS — R7303 Prediabetes: Secondary | ICD-10-CM | POA: Diagnosis not present

## 2020-12-04 DIAGNOSIS — Z713 Dietary counseling and surveillance: Secondary | ICD-10-CM | POA: Diagnosis not present

## 2020-12-04 DIAGNOSIS — Z712 Person consulting for explanation of examination or test findings: Secondary | ICD-10-CM | POA: Diagnosis not present

## 2020-12-11 DIAGNOSIS — Z20822 Contact with and (suspected) exposure to covid-19: Secondary | ICD-10-CM | POA: Diagnosis not present

## 2020-12-11 DIAGNOSIS — J3489 Other specified disorders of nose and nasal sinuses: Secondary | ICD-10-CM | POA: Diagnosis not present

## 2020-12-16 DIAGNOSIS — B349 Viral infection, unspecified: Secondary | ICD-10-CM | POA: Diagnosis not present

## 2020-12-16 DIAGNOSIS — Z20822 Contact with and (suspected) exposure to covid-19: Secondary | ICD-10-CM | POA: Diagnosis not present

## 2020-12-17 DIAGNOSIS — B349 Viral infection, unspecified: Secondary | ICD-10-CM | POA: Diagnosis not present

## 2020-12-17 DIAGNOSIS — Z20822 Contact with and (suspected) exposure to covid-19: Secondary | ICD-10-CM | POA: Diagnosis not present

## 2020-12-18 DIAGNOSIS — Z713 Dietary counseling and surveillance: Secondary | ICD-10-CM | POA: Diagnosis not present

## 2020-12-18 DIAGNOSIS — Z6841 Body Mass Index (BMI) 40.0 and over, adult: Secondary | ICD-10-CM | POA: Diagnosis not present

## 2020-12-18 DIAGNOSIS — Z7689 Persons encountering health services in other specified circumstances: Secondary | ICD-10-CM | POA: Diagnosis not present

## 2021-01-13 DIAGNOSIS — Z713 Dietary counseling and surveillance: Secondary | ICD-10-CM | POA: Diagnosis not present

## 2021-01-13 DIAGNOSIS — Z6841 Body Mass Index (BMI) 40.0 and over, adult: Secondary | ICD-10-CM | POA: Diagnosis not present

## 2021-01-13 DIAGNOSIS — R7303 Prediabetes: Secondary | ICD-10-CM | POA: Diagnosis not present

## 2021-01-30 DIAGNOSIS — F4320 Adjustment disorder, unspecified: Secondary | ICD-10-CM | POA: Diagnosis not present

## 2021-01-30 DIAGNOSIS — Z713 Dietary counseling and surveillance: Secondary | ICD-10-CM | POA: Diagnosis not present

## 2021-01-30 DIAGNOSIS — R7303 Prediabetes: Secondary | ICD-10-CM | POA: Diagnosis not present

## 2021-01-30 DIAGNOSIS — Z6841 Body Mass Index (BMI) 40.0 and over, adult: Secondary | ICD-10-CM | POA: Diagnosis not present

## 2021-02-20 DIAGNOSIS — Z713 Dietary counseling and surveillance: Secondary | ICD-10-CM | POA: Diagnosis not present

## 2021-02-20 DIAGNOSIS — R7303 Prediabetes: Secondary | ICD-10-CM | POA: Diagnosis not present

## 2021-02-20 DIAGNOSIS — Z6841 Body Mass Index (BMI) 40.0 and over, adult: Secondary | ICD-10-CM | POA: Diagnosis not present

## 2021-04-14 DIAGNOSIS — R7303 Prediabetes: Secondary | ICD-10-CM | POA: Diagnosis not present

## 2021-04-14 DIAGNOSIS — Z6841 Body Mass Index (BMI) 40.0 and over, adult: Secondary | ICD-10-CM | POA: Diagnosis not present

## 2021-04-14 DIAGNOSIS — E7800 Pure hypercholesterolemia, unspecified: Secondary | ICD-10-CM | POA: Diagnosis not present

## 2021-04-14 DIAGNOSIS — E669 Obesity, unspecified: Secondary | ICD-10-CM | POA: Diagnosis not present

## 2021-04-24 DIAGNOSIS — Z713 Dietary counseling and surveillance: Secondary | ICD-10-CM | POA: Diagnosis not present

## 2021-05-13 DIAGNOSIS — Z01419 Encounter for gynecological examination (general) (routine) without abnormal findings: Secondary | ICD-10-CM | POA: Diagnosis not present

## 2021-05-13 DIAGNOSIS — Z6841 Body Mass Index (BMI) 40.0 and over, adult: Secondary | ICD-10-CM | POA: Diagnosis not present

## 2021-05-13 DIAGNOSIS — Z124 Encounter for screening for malignant neoplasm of cervix: Secondary | ICD-10-CM | POA: Diagnosis not present

## 2021-06-23 DIAGNOSIS — Z713 Dietary counseling and surveillance: Secondary | ICD-10-CM | POA: Diagnosis not present

## 2021-06-25 DIAGNOSIS — Z7689 Persons encountering health services in other specified circumstances: Secondary | ICD-10-CM | POA: Diagnosis not present

## 2021-06-25 DIAGNOSIS — Z6841 Body Mass Index (BMI) 40.0 and over, adult: Secondary | ICD-10-CM | POA: Diagnosis not present

## 2021-06-25 DIAGNOSIS — E668 Other obesity: Secondary | ICD-10-CM | POA: Diagnosis not present

## 2021-08-06 DIAGNOSIS — R7303 Prediabetes: Secondary | ICD-10-CM | POA: Diagnosis not present

## 2021-08-06 DIAGNOSIS — Z713 Dietary counseling and surveillance: Secondary | ICD-10-CM | POA: Diagnosis not present

## 2021-08-06 DIAGNOSIS — E668 Other obesity: Secondary | ICD-10-CM | POA: Diagnosis not present

## 2021-08-06 DIAGNOSIS — Z6841 Body Mass Index (BMI) 40.0 and over, adult: Secondary | ICD-10-CM | POA: Diagnosis not present

## 2021-09-12 DIAGNOSIS — Z1231 Encounter for screening mammogram for malignant neoplasm of breast: Secondary | ICD-10-CM | POA: Diagnosis not present

## 2021-09-18 DIAGNOSIS — Z713 Dietary counseling and surveillance: Secondary | ICD-10-CM | POA: Diagnosis not present

## 2021-09-18 DIAGNOSIS — Z6841 Body Mass Index (BMI) 40.0 and over, adult: Secondary | ICD-10-CM | POA: Diagnosis not present

## 2021-09-18 DIAGNOSIS — F439 Reaction to severe stress, unspecified: Secondary | ICD-10-CM | POA: Diagnosis not present

## 2021-09-18 DIAGNOSIS — R7303 Prediabetes: Secondary | ICD-10-CM | POA: Diagnosis not present

## 2021-09-18 DIAGNOSIS — E668 Other obesity: Secondary | ICD-10-CM | POA: Diagnosis not present

## 2021-10-03 IMAGING — CT CT ABD-PELV W/ CM
2 of 5 series · 17 of 46 positions shown, 19 images · IV contrast (OMNIPAQUE 300)
Comparison: None.

CLINICAL DATA: Epigastric pain

EXAM:
CT ABDOMEN AND PELVIS WITH CONTRAST
TECHNIQUE: Multidetector CT imaging of the abdomen and pelvis was performed
using the standard protocol following bolus administration of
intravenous contrast.
CONTRAST:  100mL OMNIPAQUE IOHEXOL 300 MG/ML  SOLN

[Series 5: coronal st · coronal · 0.87mm/px · 3 of 165 slices shown]
[im 55/165  soft-tissue]
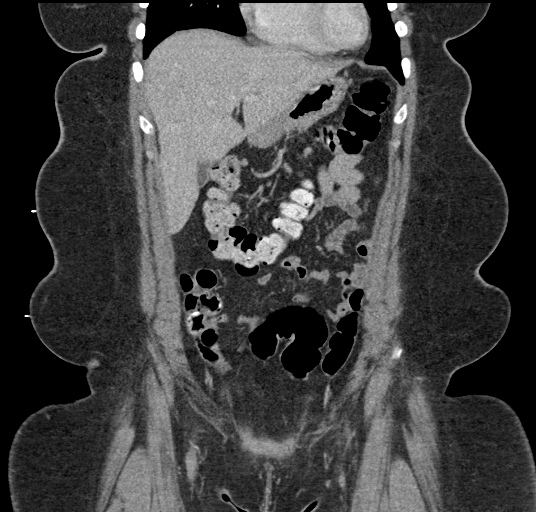
[im 73/165  soft-tissue]
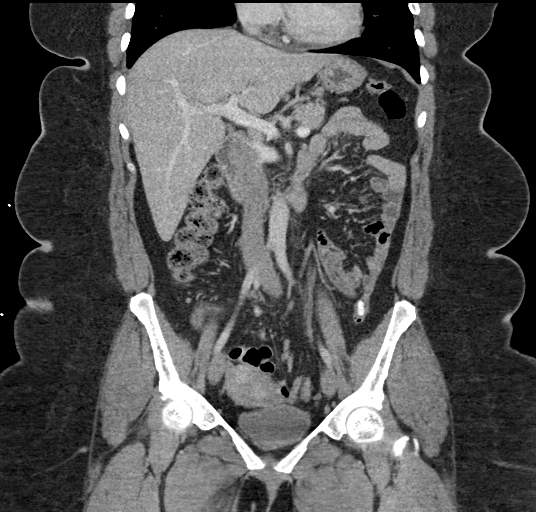
[im 92/165  soft-tissue]
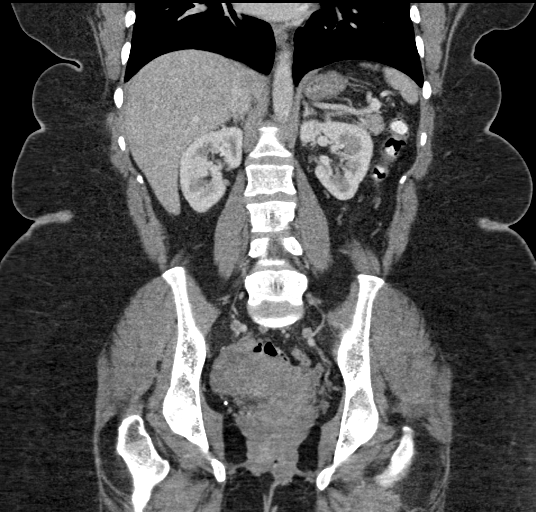

[Series 7: axial st · axial · 0.78mm/px · z∈[-425,-30]mm · 14 of 89 slices shown, 16 images]
[im 5/89  soft-tissue]
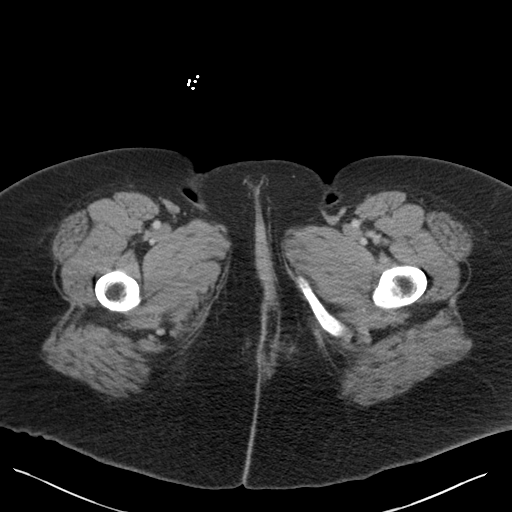
[im 5/89  bone]
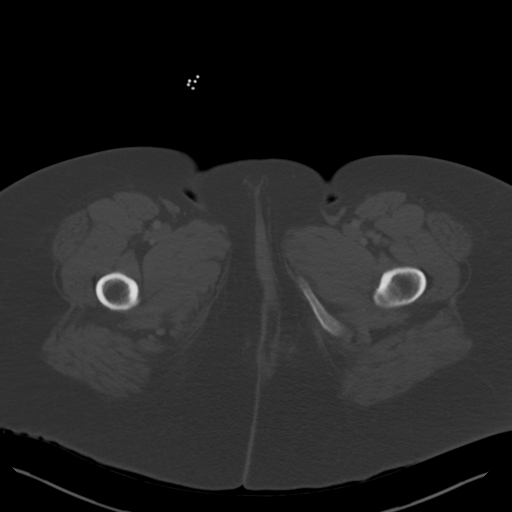
[im 10/89  soft-tissue]
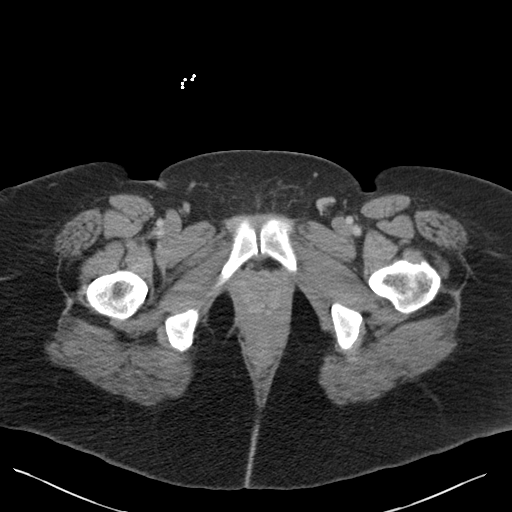
[im 20/89  soft-tissue]
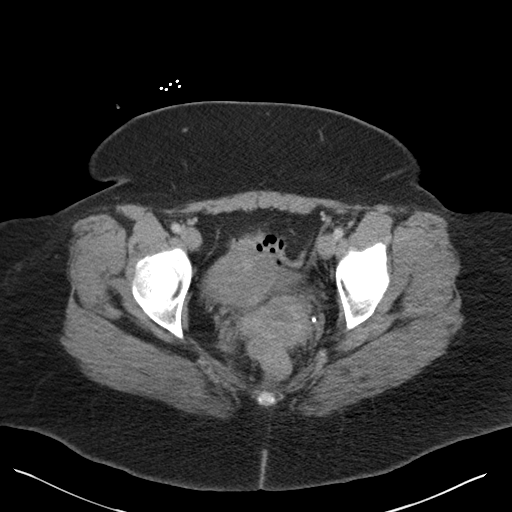
[im 25/89  soft-tissue]
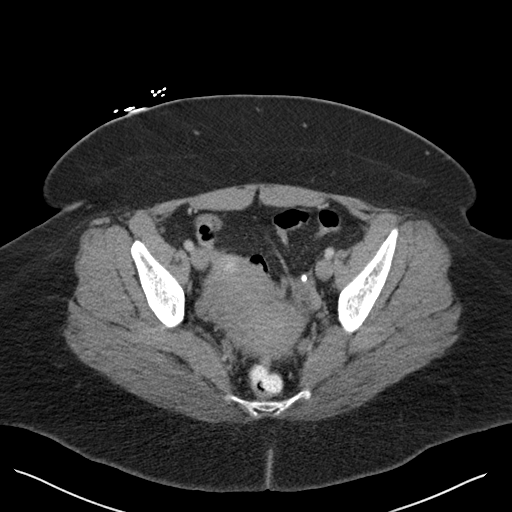
[im 30/89  soft-tissue]
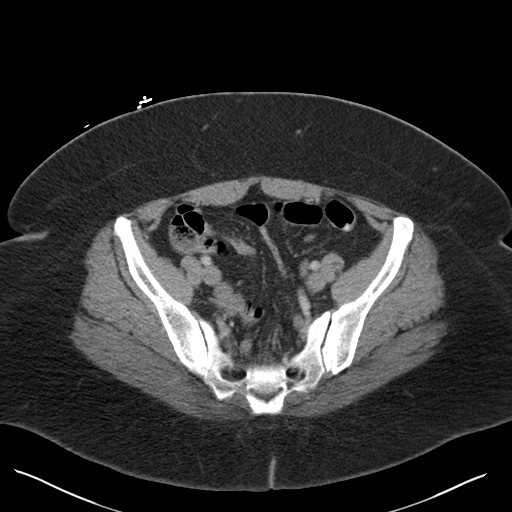
[im 35/89  soft-tissue]
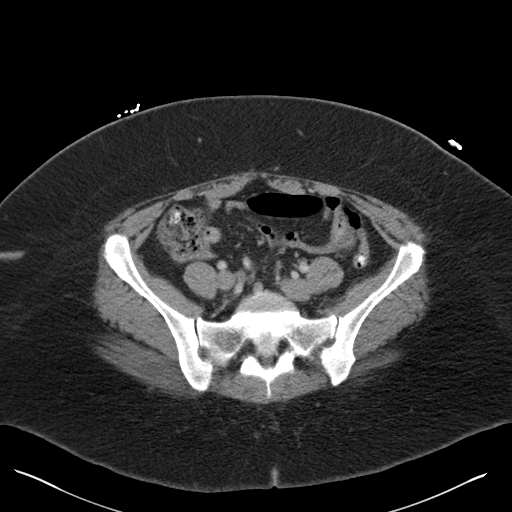
[im 40/89  soft-tissue]
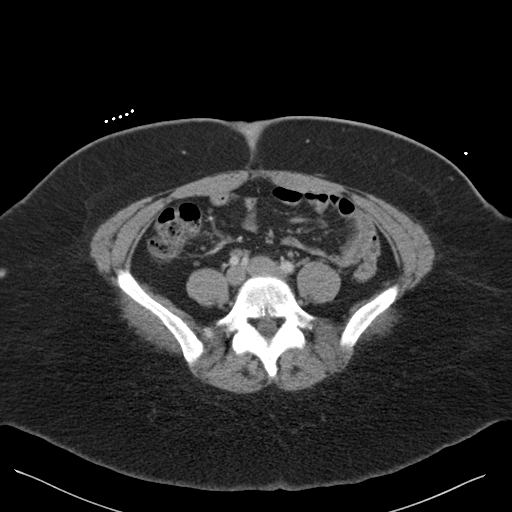
[im 49/89  soft-tissue]
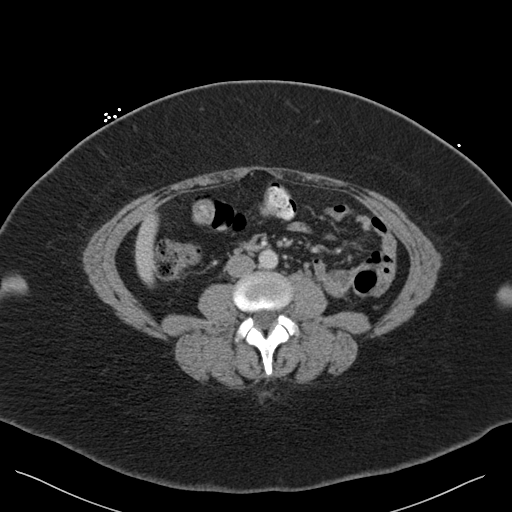
[im 54/89  soft-tissue]
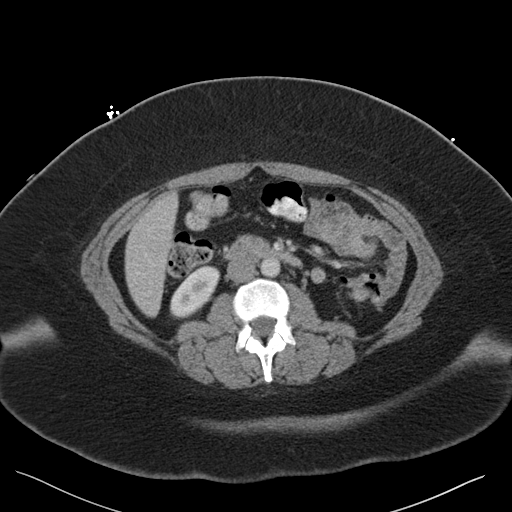
[im 54/89  bone]
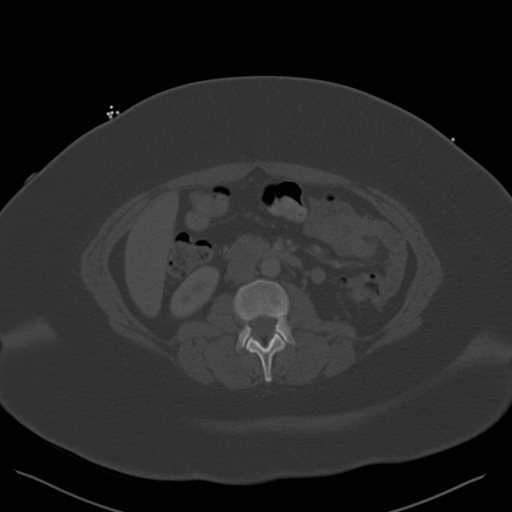
[im 59/89  soft-tissue]
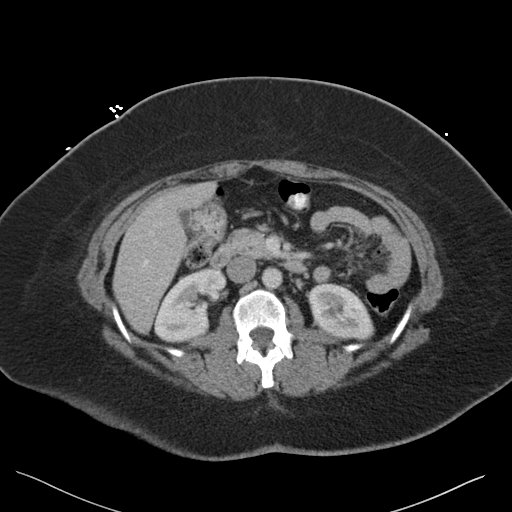
[im 64/89  soft-tissue]
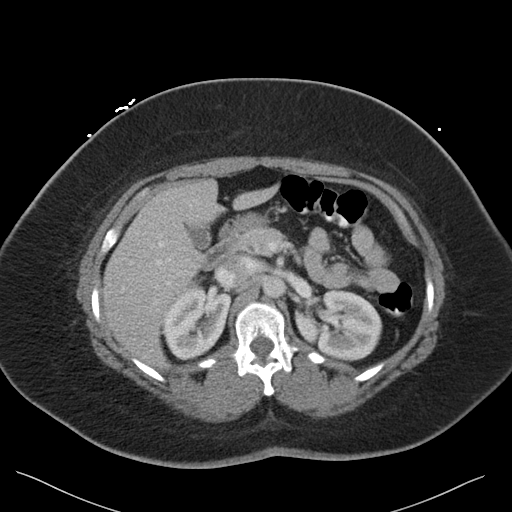
[im 69/89  soft-tissue]
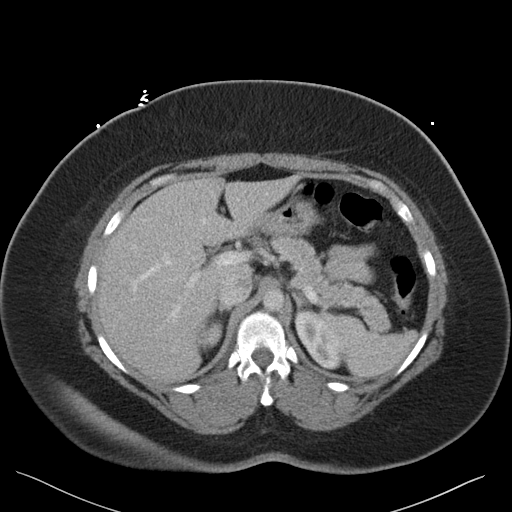
[im 79/89  soft-tissue]
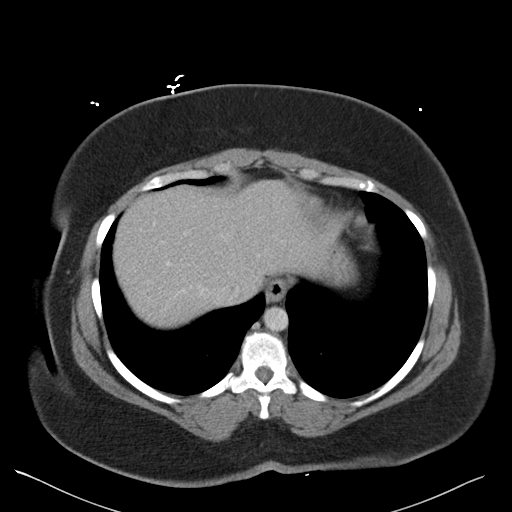
[im 84/89  soft-tissue]
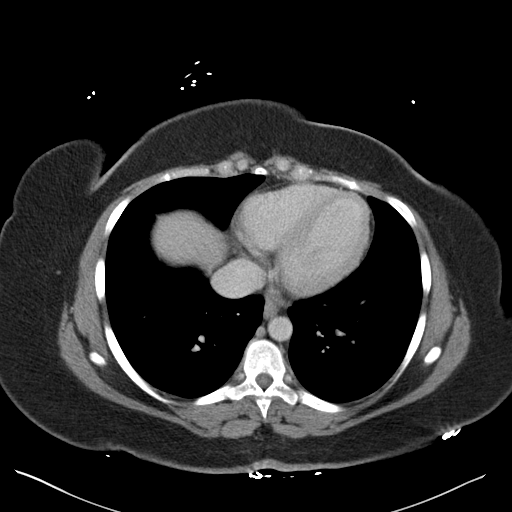

[17 of 46 positions shown; findings below may reference images not displayed]

FINDINGS: Lower chest: Lung bases are clear. No effusions. Heart is normal
size.

Hepatobiliary: No focal hepatic abnormality. Gallbladder
unremarkable.

Pancreas: No focal abnormality or ductal dilatation.

Spleen: Normal size. Small low-density lesion in the superior spleen
measures 14 mm, likely small cyst.

Adrenals/Urinary Tract: No adrenal abnormality. No focal renal
abnormality. No stones or hydronephrosis. Urinary bladder is
unremarkable.

Stomach/Bowel: Appendix is visualized and is normal. Stomach, large
and small bowel grossly unremarkable.

Vascular/Lymphatic: No evidence of aneurysm or adenopathy.

Reproductive: Small enhancing areas within the uterus, likely small
fibroids. No adnexal mass.

Other: No free fluid or free air.

Musculoskeletal: No acute bony abnormality.
IMPRESSION: No acute findings in the abdomen or pelvis.

Normal appendix.

Small uterine fibroids.

## 2021-10-17 DIAGNOSIS — Z0000 Encounter for general adult medical examination without abnormal findings: Secondary | ICD-10-CM | POA: Diagnosis not present

## 2021-10-17 DIAGNOSIS — Z2821 Immunization not carried out because of patient refusal: Secondary | ICD-10-CM | POA: Diagnosis not present

## 2021-10-17 DIAGNOSIS — E559 Vitamin D deficiency, unspecified: Secondary | ICD-10-CM | POA: Diagnosis not present

## 2021-10-17 DIAGNOSIS — Z6841 Body Mass Index (BMI) 40.0 and over, adult: Secondary | ICD-10-CM | POA: Diagnosis not present

## 2021-10-17 DIAGNOSIS — R7303 Prediabetes: Secondary | ICD-10-CM | POA: Diagnosis not present

## 2021-10-17 DIAGNOSIS — D573 Sickle-cell trait: Secondary | ICD-10-CM | POA: Diagnosis not present

## 2021-10-17 DIAGNOSIS — Z23 Encounter for immunization: Secondary | ICD-10-CM | POA: Diagnosis not present

## 2021-10-17 DIAGNOSIS — D649 Anemia, unspecified: Secondary | ICD-10-CM | POA: Diagnosis not present

## 2021-10-17 DIAGNOSIS — E661 Drug-induced obesity: Secondary | ICD-10-CM | POA: Diagnosis not present

## 2021-11-11 DIAGNOSIS — E668 Other obesity: Secondary | ICD-10-CM | POA: Diagnosis not present

## 2021-11-11 DIAGNOSIS — Z6841 Body Mass Index (BMI) 40.0 and over, adult: Secondary | ICD-10-CM | POA: Diagnosis not present

## 2021-11-11 DIAGNOSIS — F439 Reaction to severe stress, unspecified: Secondary | ICD-10-CM | POA: Diagnosis not present

## 2021-11-11 DIAGNOSIS — Z713 Dietary counseling and surveillance: Secondary | ICD-10-CM | POA: Diagnosis not present

## 2022-07-08 ENCOUNTER — Encounter (INDEPENDENT_AMBULATORY_CARE_PROVIDER_SITE_OTHER): Payer: Self-pay

## 2023-07-11 ENCOUNTER — Encounter (HOSPITAL_BASED_OUTPATIENT_CLINIC_OR_DEPARTMENT_OTHER): Payer: Self-pay

## 2023-07-11 ENCOUNTER — Emergency Department (HOSPITAL_BASED_OUTPATIENT_CLINIC_OR_DEPARTMENT_OTHER): Payer: No Typology Code available for payment source

## 2023-07-11 ENCOUNTER — Emergency Department (HOSPITAL_BASED_OUTPATIENT_CLINIC_OR_DEPARTMENT_OTHER)
Admission: EM | Admit: 2023-07-11 | Discharge: 2023-07-11 | Disposition: A | Discharge status: Home / Self Care | Payer: No Typology Code available for payment source | Attending: Emergency Medicine | Admitting: Emergency Medicine

## 2023-07-11 DIAGNOSIS — R1033 Periumbilical pain: Secondary | ICD-10-CM | POA: Insufficient documentation

## 2023-07-11 DIAGNOSIS — R110 Nausea: Secondary | ICD-10-CM | POA: Diagnosis not present

## 2023-07-11 DIAGNOSIS — R1013 Epigastric pain: Secondary | ICD-10-CM | POA: Insufficient documentation

## 2023-07-11 LAB — COMPREHENSIVE METABOLIC PANEL
ALT: 10 U/L (ref 0–44)
AST: 15 U/L (ref 15–41)
Albumin: 4.2 g/dL (ref 3.5–5.0)
Alkaline Phosphatase: 44 U/L (ref 38–126)
Anion gap: 8 (ref 5–15)
BUN: 8 mg/dL (ref 6–20)
CO2: 26 mmol/L (ref 22–32)
Calcium: 8.9 mg/dL (ref 8.9–10.3)
Chloride: 106 mmol/L (ref 98–111)
Creatinine, Ser: 0.81 mg/dL (ref 0.44–1.00)
GFR, Estimated: >60 mL/min (ref >60)
Glucose, Bld: 107 mg/dL — ABNORMAL HIGH (ref 70–99)
Potassium: 3.5 mmol/L (ref 3.5–5.1)
Sodium: 140 mmol/L (ref 135–145)
Total Bilirubin: 0.4 mg/dL (ref 0.3–1.2)
Total Protein: 6.8 g/dL (ref 6.5–8.1)

## 2023-07-11 LAB — URINALYSIS, ROUTINE W REFLEX MICROSCOPIC
Bilirubin Urine: NEGATIVE
Glucose, UA: NEGATIVE mg/dL
Hgb urine dipstick: NEGATIVE
Ketones, ur: NEGATIVE mg/dL
Leukocytes,Ua: NEGATIVE
Nitrite: NEGATIVE
Protein, ur: NEGATIVE mg/dL
Specific Gravity, Urine: 1.028 (ref 1.005–1.030)
pH: 7 (ref 5.0–8.0)

## 2023-07-11 LAB — PREGNANCY, URINE: Preg Test, Ur: NEGATIVE

## 2023-07-11 LAB — CBC
HCT: 35.8 % — ABNORMAL LOW (ref 36.0–46.0)
Hemoglobin: 11.8 g/dL — ABNORMAL LOW (ref 12.0–15.0)
MCH: 29.6 pg (ref 26.0–34.0)
MCHC: 33 g/dL (ref 30.0–36.0)
MCV: 89.9 fL (ref 80.0–100.0)
Platelets: 277 10*3/uL (ref 150–400)
RBC: 3.98 MIL/uL (ref 3.87–5.11)
RDW: 14.5 % (ref 11.5–15.5)
WBC: 5.9 10*3/uL (ref 4.0–10.5)
nRBC: 0 % (ref 0.0–0.2)

## 2023-07-11 LAB — LIPASE, BLOOD: Lipase: 25 U/L (ref 11–51)

## 2023-07-11 MED ORDER — OMEPRAZOLE 20 MG PO CPDR
20.0000 mg | DELAYED_RELEASE_CAPSULE | Freq: Every day | ORAL | 0 refills | Status: AC
Start: 2023-07-11 — End: 2023-07-25

## 2023-07-11 MED ORDER — ONDANSETRON HCL 4 MG/2ML IJ SOLN
4.0000 mg | Freq: Once | INTRAMUSCULAR | Status: AC
  Administered 2023-07-11: 4 mg via INTRAVENOUS
  Filled 2023-07-11: qty 2

## 2023-07-11 MED ORDER — IOHEXOL 300 MG/ML  SOLN
100.0000 mL | Freq: Once | INTRAMUSCULAR | Status: AC | PRN
  Administered 2023-07-11: 100 mL via INTRAVENOUS

## 2023-07-11 MED ORDER — SODIUM CHLORIDE 0.9 % IV BOLUS
1000.0000 mL | Freq: Once | INTRAVENOUS | Status: AC
  Administered 2023-07-11: 1000 mL via INTRAVENOUS

## 2023-07-11 NOTE — ED Triage Notes (Signed)
Pt c/o epigastric abd pain, nausea since April/ May. States that PCP wanted to schedule CT but insurance denied it; prescribed Carafate "but it's not helping." Denies V/D, urinary symptoms.

## 2023-07-11 NOTE — Discharge Instructions (Signed)
You are seen in the emergency department today for abdominal pain.  Thankfully your labs were unremarkable.  You had a CT scan that was ordered as well which was thankfully negative for any acute abnormalities to explain your symptoms.  Due to evidence of possible uterine fibroids.  Given no specific cause for your symptoms, would recommend continuing with the sucralfate your primary care provider ordered for you but I also sent a prescription for omeprazole to take for the next 2 weeks to assess if he has improvement with this.  If you do have improvement, I would strongly advise following up with gastroenterology for further evaluation of your symptoms.  If your symptoms are worsening, return to the emergency department.

## 2023-07-11 NOTE — ED Provider Notes (Signed)
[place] EMERGENCY DEPARTMENT AT [facility] Provider Note   CSN: [id number] Arrival date & time: 07/11/23  1843     History Chief Complaint  Patient presents with   Abdominal Pain    Melissa Rasmussen is a 52 y.o. female.  Patient presents to the emergency department concerns of epigastric pain, nausea for several months.  States that her her primary care provider has been trialing a course of sucralfate without improvement in symptoms.  No prior GI history but does report IBS-C.  Has had a colonoscopy before which was unremarkable.  Denies any hematemesis, hematochezia, melena.  Not currently on any and type coagulant and denies heavy use of NSAIDs, aspirin.  Concerned about potential cause of abdominal discomfort and nausea following eating or drinking due to family history of cancer.  No prior history of cancer herself.   Abdominal Pain Associated symptoms: nausea        Home Medications Prior to Admission medications   Medication Sig Start Date End Date Taking? Authorizing Provider  omeprazole (PRILOSEC) 20 MG capsule Take 1 capsule (20 mg total) by mouth daily for 14 days. 07/11/23 07/25/23 Yes Smitty Knudsen, PA-C  buPROPion (WELLBUTRIN SR) 150 MG 12 hr tablet Take 1 tablet (150 mg total) by mouth daily. Patient not taking: Reported on 03/05/2020 12/12/19   Corinna Capra A, DO  Multiple Vitamins-Minerals (MULTIVITAMIN GUMMIES ADULTS PO) Take by mouth.    [provider]  ondansetron (ZOFRAN) 4 MG tablet Take 1 tablet (4 mg total) by mouth every 6 (six) hours. 03/05/20   Couture, Cortni S, PA-C  pantoprazole (PROTONIX) 40 MG tablet Take 1 tablet (40 mg total) by mouth daily for 14 days. 03/05/20 03/19/20  Couture, Cortni S, PA-C  Vitamin D, Ergocalciferol, (DRISDOL) 1.25 MG (50000 UNIT) CAPS capsule Take 1 capsule (50,000 Units total) by mouth every 7 (seven) days. Patient not taking: Reported on 03/05/2020 12/12/19   Corinna Capra A, DO      Allergies    Patient has no  known allergies.    Review of Systems   Review of Systems  Gastrointestinal:  Positive for abdominal pain and nausea.  All other systems reviewed and are negative.   Physical Exam Updated Vital Signs BP 126/81   Pulse (!) 55   Temp 97.8 F (36.6 C)   Resp 18   SpO2 99%  Physical Exam Vitals and nursing note reviewed.  Constitutional:      General: She is not in acute distress.    Appearance: She is well-developed.  HENT:     Head: Normocephalic and atraumatic.  Eyes:     Conjunctiva/sclera: Conjunctivae normal.  Cardiovascular:     Rate and Rhythm: Normal rate and regular rhythm.     Heart sounds: No murmur heard. Pulmonary:     Effort: Pulmonary effort is normal. No respiratory distress.     Breath sounds: Normal breath sounds.  Abdominal:     General: Abdomen is flat. Bowel sounds are normal. There is no distension.     Palpations: Abdomen is soft. There is no shifting dullness or mass.     Tenderness: There is generalized abdominal tenderness.  Musculoskeletal:        General: No swelling.     Cervical back: Neck supple.  Skin:    General: Skin is warm and dry.     Capillary Refill: Capillary refill takes less than 2 seconds.  Neurological:     Mental Status: She is alert.  Psychiatric:  Mood and Affect: Mood normal.     ED Results / Procedures / Treatments   Labs (all labs ordered are listed, but only abnormal results are displayed) Labs Reviewed  COMPREHENSIVE METABOLIC PANEL - Abnormal; Notable for the following components:      Result Value   Glucose, Bld 107 (*)    All other components within normal limits  CBC - Abnormal; Notable for the following components:   Hemoglobin 11.8 (*)    HCT 35.8 (*)    All other components within normal limits  URINALYSIS, ROUTINE W REFLEX MICROSCOPIC - Abnormal; Notable for the following components:   Color, Urine COLORLESS (*)    All other components within normal limits  LIPASE, BLOOD  PREGNANCY, URINE     EKG None  Radiology CT ABDOMEN PELVIS W CONTRAST  Result Date: 07/11/2023 CLINICAL DATA:  Abdominal pain, acute, nonlocalized EXAM: CT ABDOMEN AND PELVIS WITH CONTRAST TECHNIQUE: Multidetector CT imaging of the abdomen and pelvis was performed using the standard protocol following bolus administration of intravenous contrast. RADIATION DOSE REDUCTION: This exam was performed according to the departmental dose-optimization program which includes automated exposure control, adjustment of the mA and/or kV according to patient size and/or use of iterative reconstruction technique. CONTRAST:  OMNIPAQUE IOHEXOL 300 MG/ML  SOLN COMPARISON:  03/05/2020 FINDINGS: Lower chest: Included lung bases are clear.  Heart size is normal. Hepatobiliary: No focal liver abnormality is seen. No gallstones, gallbladder wall thickening, or biliary dilatation. Pancreas: Unremarkable. No pancreatic ductal dilatation or surrounding inflammatory changes. Spleen: Stable cyst within the spleen. Otherwise normal in appearance. Adrenals/Urinary Tract: Unremarkable adrenal glands. Kidneys enhance symmetrically without focal lesion, stone, or hydronephrosis. Ureters are nondilated. Urinary bladder appears unremarkable for the degree of distention. Stomach/Bowel: Stomach is within normal limits. Appendix appears normal (series 2, image 55). No evidence of bowel wall thickening, distention, or inflammatory changes. Vascular/Lymphatic: No significant vascular findings are present. No enlarged abdominal or pelvic lymph nodes. Reproductive: Probable small uterine fibroids.  No adnexal masses. Other: No free fluid. No abdominopelvic fluid collection. No pneumoperitoneum. No abdominal wall hernia. Musculoskeletal: No acute or significant osseous findings. IMPRESSION: 1. No acute abdominopelvic findings. 2. Probable small uterine fibroids. Electronically Signed   By: Duanne Guess D.O.   On: 07/11/2023 21:16    Procedures Procedures    Medications Ordered in ED Medications  sodium chloride 0.9 % bolus 1,000 mL (0 mLs Intravenous Stopped 07/11/23 2243)  ondansetron (ZOFRAN) injection 4 mg (4 mg Intravenous Given 07/11/23 2049)  iohexol (OMNIPAQUE) 300 MG/ML solution 100 mL (100 mLs Intravenous Contrast Given 07/11/23 2058)    ED Course/ Medical Decision Making/ A&P                               Medical Decision Making Amount and/or Complexity of Data Reviewed Labs: ordered. Radiology: ordered.  Risk Prescription drug management.   This patient presents to the ED for concern of abdominal pain.  Differential diagnosis includes diverticulitis, bowel obstruction, gastric ulcer, gastroenteritis,   Lab Tests:  I Ordered, and personally interpreted labs.  The pertinent results include: CBC with mild decline in hemoglobin hematocrit, CMP unremarkable, lipase normal, urine pregnancy negative, UA without obvious signs of infection   Imaging Studies ordered:  I ordered imaging studies including CT abdomen pelvis with contrast I independently visualized and interpreted imaging which showed no acute abdominal normality, possible uterine fibroids I agree with the radiologist interpretation  Medicines ordered and prescription drug management:  I ordered medication including fluids, Zofran for dehydration, nausea Reevaluation of the patient after these medicines showed that the patient improved I have reviewed the patients home medicines and have made adjustments as needed   Problem List / ED Course:  Patient presents to the emergency department complaints of abdominal pain.  Reports that she has been having ongoing issues with abdominal pain for several months now.  Recently started on Carafate by primary care provider to address possible cause of abdominal pain.  States this has not helped her symptoms at all.  Denies any changes in pain with eating without eating.  No vomiting or diarrhea.  No prior history of  significant or similar symptoms.  Will assess with basic labs and possible imaging if needed. Labs are largely unremarkable with no acute signs of infection or other abnormality to account for patient's pain.  No evidence of UTI and patient is not currently pregnant.  Pain is difficult to localize on examination.  Will order CT abdomen pelvis for further evaluation of symptoms.  In the meantime, fluids and Zofran ordered for symptomatic treatment. CT scan is thankfully unremarkable for any specific cause the patient's symptoms.  Does appear to have likely uterine fibroids the patient is aware of.  Will have patient follow-up with primary care provider for further evaluation as needed.  Did encourage patient to trial a course of omeprazole given that patient's symptoms do sound potentially acid reflux related.  Patient is agreeable with this plan and verbalized understanding strict return precautions.  All questions answered prior to patient discharge.  Final Clinical Impression(s) / ED Diagnoses Final diagnoses:  Periumbilical abdominal pain  Dyspepsia    Rx / DC Orders ED Discharge Orders          Ordered    omeprazole (PRILOSEC) 20 MG capsule  Daily        07/11/23 2320              Smitty Knudsen, PA-C 07/11/23 2330    Horton, Clabe Seal, DO 07/12/23 1541

## 2023-07-11 NOTE — ED Notes (Signed)
Patient reports to nurse that she takes linzess daily.

## 2023-08-24 ENCOUNTER — Other Ambulatory Visit (HOSPITAL_COMMUNITY): Payer: Self-pay | Admitting: Gastroenterology

## 2023-08-24 DIAGNOSIS — R1011 Right upper quadrant pain: Secondary | ICD-10-CM

## 2023-09-02 ENCOUNTER — Ambulatory Visit (HOSPITAL_COMMUNITY)
Admission: RE | Admit: 2023-09-02 | Discharge: 2023-09-02 | Disposition: A | Discharge status: Home / Self Care | Payer: No Typology Code available for payment source | Source: Ambulatory Visit | Attending: Gastroenterology | Admitting: Gastroenterology

## 2023-09-02 DIAGNOSIS — R1011 Right upper quadrant pain: Secondary | ICD-10-CM | POA: Diagnosis present

## 2023-09-06 ENCOUNTER — Ambulatory Visit (HOSPITAL_COMMUNITY)
Admission: RE | Admit: 2023-09-06 | Discharge: 2023-09-06 | Disposition: A | Discharge status: Home / Self Care | Payer: No Typology Code available for payment source | Source: Ambulatory Visit | Attending: Gastroenterology | Admitting: Gastroenterology

## 2023-09-06 DIAGNOSIS — R1011 Right upper quadrant pain: Secondary | ICD-10-CM | POA: Diagnosis present

## 2023-09-06 MED ORDER — TECHNETIUM TC 99M MEBROFENIN IV KIT
5.4000 | PACK | Freq: Once | INTRAVENOUS | Status: AC | PRN
  Administered 2023-09-06: 5.4 via INTRAVENOUS

## 2024-12-20 ENCOUNTER — Ambulatory Visit (HOSPITAL_BASED_OUTPATIENT_CLINIC_OR_DEPARTMENT_OTHER): Attending: Orthopedic Surgery | Admitting: Physical Therapy

## 2024-12-20 ENCOUNTER — Other Ambulatory Visit: Payer: Self-pay

## 2024-12-20 DIAGNOSIS — R262 Difficulty in walking, not elsewhere classified: Secondary | ICD-10-CM | POA: Diagnosis present

## 2024-12-20 DIAGNOSIS — M6281 Muscle weakness (generalized): Secondary | ICD-10-CM | POA: Insufficient documentation

## 2024-12-20 DIAGNOSIS — M25571 Pain in right ankle and joints of right foot: Secondary | ICD-10-CM | POA: Insufficient documentation

## 2024-12-20 DIAGNOSIS — M25671 Stiffness of right ankle, not elsewhere classified: Secondary | ICD-10-CM | POA: Diagnosis present

## 2024-12-20 NOTE — Therapy (Signed)
 " OUTPATIENT PHYSICAL THERAPY LOWER EXTREMITY EVALUATION   Patient Name: Melissa Rasmussen MRN: [id number] DOB:[birth year], 54 y.o., female Today's Date: 12/21/2024  END OF SESSION:  PT End of Session - 12/20/24 1629     Visit Number 1    Number of Visits 20    Date for Recertification  02/28/25    Authorization Type [employer]ET[employer]    PT Start Time 1623    PT Stop Time 1722    PT Time Calculation (min) 59 min    Activity Tolerance Patient tolerated treatment well    Behavior During Therapy WFL for tasks assessed/performed          Past Medical History:  Diagnosis Date   Allergies    Anemia    Back pain with sciatica    Bilateral swelling of feet and ankles    Knee pain    Vitamin D  deficiency    Past Surgical History:  Procedure Laterality Date   ABLATION     NOSE SURGERY  8/06 or 07   TUBAL LIGATION  12/2004   Patient Active Problem List   Diagnosis Date Noted   Class 3 drug-induced obesity without serious comorbidity with body mass index (BMI) of 45.0 to 49.9 in adult [facility]) 11/08/2019   Migraine 02/25/2017   Sickle cell trait 02/25/2017     REFERRING PROVIDER: Gaile Murray NOVAK, MD   REFERRING DIAG: M67.88  Achilles Tendinosis  S/p R achilles tendon debridement and repair and Haglund deformity resection   THERAPY DIAG:  Pain in right ankle and joints of right foot  Stiffness of right ankle, not elsewhere classified  Muscle weakness (generalized)  Difficulty in walking, not elsewhere classified  Rationale for Evaluation and Treatment: Rehabilitation  ONSET DATE: DOS 09/22/24  SUBJECTIVE:   SUBJECTIVE STATEMENT: Pt states she walked everyday.  Pain began in L achilles initially in Spring 2024.  She then began having pain in R achilles which was more severe.  Pt went to see a podiatrist and they tried a cast and a boot which didn't help.  She then had plantar fasciitis in Dec 2024 in bilat feet.  Pt underwent R achilles tendon debridement and repair and Haglund deformity  resection on 09/22/24.  Pt used a knee scooter.  She states the wound wasn't healed when they took the stitches out.  The proximal incision is healed thought the distal part of the incision is healing.  Pt states she wore the boot for 3 times.  Pt saw MD on 12/12/24.  She wanted her to start with PT and prescribed Gabapentin.  MD instructed her to use silicon scar tape for the incision.  She is using an adjustable gel heel lift and MD wanted her to use all 3 layers.  Pt has not received any PT or any HEP.   Pt is not performing stairs.  She has difficulty with ambulation and is limited with ambulation.  Pt states she is unable to get on her toes to reach for something.  She has pain with car transfers.  Household chores including cooking takes much longer due to limited standing duration.    PERTINENT HISTORY: R achilles tendon debridement and repair and Haglund deformity resection on 09/22/24  Back pain with sciatica Bilat knee pain Anemia Swelling in R ankle s/p sprain since '04-05  PAIN:  NPRS:  2/10 current, 7/10 worst, 2/10 best Location:  Achilles, medial ankle Pt states she gets sharp pains.  PRECAUTIONS: {Therapy precautions:24002}   WEIGHT BEARING RESTRICTIONS: {Yes ***/  No:24003}  FALLS:  Has patient fallen in last 6 months? Yes. Number of falls 2  LIVING ENVIRONMENT: Lives with: lives alone Lives in: 2 story home.  Pt lives on the 1st floor.   OCCUPATION: Project Management.  Sedentary job  PLOF: Independent  PATIENT GOALS:  walk without pain.  Stand on her tip toes to reach something.  OBJECTIVE:  Note: Objective measures were completed at Evaluation unless otherwise noted.  DIAGNOSTIC FINDINGS: Pt is post op  PATIENT SURVEYS:  LEFS:  43/80  COGNITION: Overall cognitive status: Within functional limits for tasks assessed      OBSERVATION:  Pt has a band-aid over the mid to distal portion of her incision.  Proximal portion of incision is closed.   Incision  with small area of wound dehiscence distally, no signs if infection, not deep - improved compared to previous ---  PALPATION: TTP  medial R ankle and R achilles.  LOWER EXTREMITY ROM:  {AROM/PROM:27142} ROM Right eval Left eval  Hip flexion    Hip extension    Hip abduction    Hip adduction    Hip internal rotation    Hip external rotation    Knee flexion    Knee extension    Ankle dorsiflexion 5/6 AROM/PROM 13  Ankle plantarflexion 47 63  Ankle inversion 28 38  Ankle eversion 10 18   (Blank rows = not tested)  LOWER EXTREMITY MMT:  MMT Right eval Left eval  Hip flexion    Hip extension    Hip abduction    Hip adduction    Hip internal rotation    Hip external rotation    Knee flexion    Knee extension    Ankle dorsiflexion    Ankle plantarflexion    Ankle inversion    Ankle eversion     (Blank rows = not tested)   GAIT: Assistive device utilized: None Comments: Decreased DF in stance, decreased toe off, antalgic limp, decreased Wb'ing through R LE                                                                                                                                TREATMENT:   Pt performed ankle pumps 2x10 reps.  PT instructed pt to not perform in a painful or tight range.  PT instructed pt she should not feel a tension in achilles with ankle pumps.  Pt performed ankle circles 2x10 cw, ccw and seated heel raises 2x10.  PT instructed pt she should not have pain with exercises.  PT gave pt a HEP handout and was educated in correct form and appropriate frequency.    PATIENT EDUCATION:  Education details: Post op and protocol limitations and restrictions, POC, objective findings, dx, relevant anatomy, HEP, and rationale of interventions.  PT answered pt's questions.  Person educated: {Person educated:25204} Education method: {Education Method:25205} Education comprehension: {Education Comprehension:25206}  HOME EXERCISE PROGRAM: Access Code:  [email or web address] URL: [email or web address]  Date: 12/21/2024 Prepared by: Mose Minerva  Exercises - Long Sitting Ankle Pumps  - 2 x daily - 7 x weekly - 2 sets - 10 reps - Seated Ankle Circles  - 2 x daily - 7 x weekly - 2 sets - 10 reps - Seated Heel Raise  - 1 x daily - 5 x weekly - 2 sets - 10 reps  ASSESSMENT:  CLINICAL IMPRESSION: Patient is a 54 y.o. female 12 weeks and 5 days R achilles tendon debridement and repair and Haglund deformity resection   OBJECTIVE IMPAIRMENTS: {opptimpairments:25111}.   ACTIVITY LIMITATIONS: {activitylimitations:27494}  PARTICIPATION LIMITATIONS: {participationrestrictions:25113}  PERSONAL FACTORS: {Personal factors:25162} are also affecting patient's functional outcome.   REHAB POTENTIAL: Good  CLINICAL DECISION MAKING: Stable/uncomplicated  EVALUATION COMPLEXITY: Low   GOALS:   SHORT TERM GOALS: Target date: *** *** Baseline: Goal status: INITIAL  2.  *** Baseline:  Goal status: INITIAL  3.  *** Baseline:  Goal status: INITIAL  4.  *** Baseline:  Goal status: INITIAL  5.  *** Baseline:  Goal status: INITIAL  6.  *** Baseline:  Goal status: INITIAL  LONG TERM GOALS: Target date: 02/28/2025  *** Baseline:  Goal status: INITIAL  2.  *** Baseline:  Goal status: INITIAL  3.  *** Baseline:  Goal status: INITIAL  4.  *** Baseline:  Goal status: INITIAL  5.  *** Baseline:  Goal status: INITIAL  6.  *** Baseline:  Goal status: INITIAL   PLAN:  PT FREQUENCY: 2x/week  PT DURATION: 10 weeks  PLANNED INTERVENTIONS: {rehab planned interventions:25118::97110-Therapeutic exercises,97530- Therapeutic [phone]- Neuromuscular re-education,97535- Self Rjmz,02859- Manual therapy,Patient/Family education}  PLAN FOR NEXT SESSION: PIERRETTE Mose Minerva, PT 12/21/2024, 10:07 PM  "

## 2024-12-21 ENCOUNTER — Encounter (HOSPITAL_BASED_OUTPATIENT_CLINIC_OR_DEPARTMENT_OTHER): Payer: Self-pay | Admitting: Physical Therapy
